# Patient Record
Sex: Male | Born: 1960 | Race: White | Hispanic: No | Smoking: Never smoker
Health system: Southern US, Community
[De-identification: ages and names within clinical notes are randomized; demographics above are authoritative.]

## PROBLEM LIST (undated history)

## (undated) DIAGNOSIS — Z9889 Other specified postprocedural states: Secondary | ICD-10-CM

## (undated) DIAGNOSIS — G4730 Sleep apnea, unspecified: Secondary | ICD-10-CM

## (undated) DIAGNOSIS — Z8719 Personal history of other diseases of the digestive system: Secondary | ICD-10-CM

## (undated) DIAGNOSIS — I10 Essential (primary) hypertension: Secondary | ICD-10-CM

## (undated) HISTORY — DX: Other specified postprocedural states: Z98.890

## (undated) HISTORY — DX: Personal history of other diseases of the digestive system: Z87.19

## (undated) HISTORY — PX: HERNIA REPAIR: SHX51

## (undated) HISTORY — PX: CARPAL TUNNEL RELEASE: SHX101

## (undated) HISTORY — PX: SHOULDER SURGERY: SHX246

---

## 2005-01-19 ENCOUNTER — Ambulatory Visit (HOSPITAL_COMMUNITY): Admission: RE | Admit: 2005-01-19 | Discharge: 2005-01-19 | Payer: Self-pay | Admitting: Orthopedic Surgery

## 2009-02-10 ENCOUNTER — Encounter: Admission: RE | Admit: 2009-02-10 | Discharge: 2009-02-10 | Payer: Self-pay | Admitting: General Surgery

## 2009-02-14 ENCOUNTER — Ambulatory Visit (HOSPITAL_BASED_OUTPATIENT_CLINIC_OR_DEPARTMENT_OTHER): Admission: RE | Admit: 2009-02-14 | Discharge: 2009-02-14 | Payer: Self-pay | Admitting: General Surgery

## 2010-06-28 LAB — BASIC METABOLIC PANEL
BUN: 20 mg/dL (ref 6–23)
CO2: 29 mEq/L (ref 19–32)
Calcium: 9.2 mg/dL (ref 8.4–10.5)
Chloride: 107 mEq/L (ref 96–112)
Creatinine, Ser: 1.18 mg/dL (ref 0.4–1.5)
GFR calc Af Amer: >60 mL/min (ref >60)
GFR calc non Af Amer: >60 mL/min (ref >60)
Glucose, Bld: 109 mg/dL — ABNORMAL HIGH (ref 70–99)
Potassium: 4.3 mEq/L (ref 3.5–5.1)
Sodium: 141 mEq/L (ref 135–145)

## 2010-06-28 LAB — DIFFERENTIAL
Basophils Absolute: 0 10*3/uL (ref 0.0–0.1)
Basophils Relative: 0 % (ref 0–1)
Eosinophils Absolute: 0.3 10*3/uL (ref 0.0–0.7)
Eosinophils Relative: 4 % (ref 0–5)
Lymphocytes Relative: 30 % (ref 12–46)
Lymphs Abs: 2.3 10*3/uL (ref 0.7–4.0)
Monocytes Absolute: 0.4 10*3/uL (ref 0.1–1.0)
Monocytes Relative: 5 % (ref 3–12)
Neutro Abs: 4.6 10*3/uL (ref 1.7–7.7)
Neutrophils Relative %: 61 % (ref 43–77)

## 2010-06-28 LAB — CBC
HCT: 40.7 % (ref 39.0–52.0)
Hemoglobin: 14.1 g/dL (ref 13.0–17.0)
MCHC: 34.5 g/dL (ref 30.0–36.0)
MCV: 88.4 fL (ref 78.0–100.0)
Platelets: 238 10*3/uL (ref 150–400)
RBC: 4.6 MIL/uL (ref 4.22–5.81)
RDW: 13.9 % (ref 11.5–15.5)
WBC: 7.6 10*3/uL (ref 4.0–10.5)

## 2010-07-02 ENCOUNTER — Observation Stay (HOSPITAL_COMMUNITY)
Admission: EM | Admit: 2010-07-02 | Discharge: 2010-07-04 | Disposition: A | Discharge status: Home / Self Care | Payer: Managed Care, Other (non HMO) | Attending: Family Medicine | Admitting: Family Medicine

## 2010-07-02 ENCOUNTER — Emergency Department (HOSPITAL_COMMUNITY): Payer: Managed Care, Other (non HMO)

## 2010-07-02 DIAGNOSIS — R42 Dizziness and giddiness: Principal | ICD-10-CM | POA: Insufficient documentation

## 2010-07-02 DIAGNOSIS — I10 Essential (primary) hypertension: Secondary | ICD-10-CM | POA: Insufficient documentation

## 2010-07-02 DIAGNOSIS — R112 Nausea with vomiting, unspecified: Secondary | ICD-10-CM | POA: Insufficient documentation

## 2010-07-02 LAB — POCT I-STAT, CHEM 8
BUN: 25 mg/dL — ABNORMAL HIGH (ref 6–23)
Calcium, Ion: 1.15 mmol/L (ref 1.12–1.32)
Creatinine, Ser: 1 mg/dL (ref 0.4–1.5)
Glucose, Bld: 127 mg/dL — ABNORMAL HIGH (ref 70–99)
HCT: 44 % (ref 39.0–52.0)
Hemoglobin: 15 g/dL (ref 13.0–17.0)
Potassium: 3.8 mEq/L (ref 3.5–5.1)
Sodium: 140 mEq/L (ref 135–145)
TCO2: 25 mmol/L (ref 0–100)

## 2010-07-02 LAB — RAPID URINE DRUG SCREEN, HOSP PERFORMED
Amphetamines: NOT DETECTED
Cocaine: NOT DETECTED
Opiates: NOT DETECTED
Tetrahydrocannabinol: NOT DETECTED

## 2010-07-02 MED ORDER — GADOBENATE DIMEGLUMINE 529 MG/ML IV SOLN
20.0000 mL | Freq: Once | INTRAVENOUS | Status: AC | PRN
  Administered 2010-07-02: 20 mL via INTRAVENOUS

## 2010-07-03 LAB — CBC
MCH: 29.7 pg (ref 26.0–34.0)
MCHC: 33.3 g/dL (ref 30.0–36.0)
Platelets: 216 10*3/uL (ref 150–400)
RBC: 4.88 MIL/uL (ref 4.22–5.81)
WBC: 6.9 10*3/uL (ref 4.0–10.5)

## 2010-07-03 LAB — BASIC METABOLIC PANEL
CO2: 26 mEq/L (ref 19–32)
Calcium: 8.6 mg/dL (ref 8.4–10.5)
Chloride: 109 mEq/L (ref 96–112)
Creatinine, Ser: 1.07 mg/dL (ref 0.4–1.5)
GFR calc Af Amer: >60 mL/min (ref >60)
Potassium: 4.3 mEq/L (ref 3.5–5.1)
Sodium: 141 mEq/L (ref 135–145)

## 2010-07-03 LAB — TSH: TSH: 1.106 u[IU]/mL (ref 0.350–4.500)

## 2010-07-03 LAB — LIPID PANEL
Cholesterol: 143 mg/dL (ref 0–200)
HDL: 27 mg/dL — ABNORMAL LOW (ref >39)
LDL Cholesterol: 85 mg/dL (ref 0–99)
Triglycerides: 154 mg/dL — ABNORMAL HIGH (ref <150)
VLDL: 31 mg/dL (ref 0–40)

## 2010-07-08 NOTE — Discharge Summary (Signed)
Joshua, Myers               ACCOUNT NO.:  [id number]  MEDICAL RECORD NO.:  [id number]           PATIENT TYPE:  O  LOCATION:  5038                         FACILITY:  [facility]  PHYSICIAN:  Joshua Koch, MD        DATE OF BIRTH:  [birth year]  DATE OF ADMISSION:  07/02/2010 DATE OF DISCHARGE:  07/04/2010                              DISCHARGE SUMMARY   PRIMARY CARE PHYSICIAN:  C. Duane Lope, MD  DISCHARGE DIAGNOSES: 1. Vertigo/syncopal episode. 2. Hypertension. 3. Morbid obesity. 4. 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  DISCHARGE MEDICATIONS:  Lisinopril 20 mg once daily.  NEW MEDICATION: 1. Meclizine 25 mg 1 tablet t.i.d. p.r.n. 2. Fish oil over the counter 2 tablets daily. 3. Ibuprofen 200 mg 4 tablets daily p.r.n. 4. Glucosamine over the counter 1 tablet daily. 5. Loratadine 10 mg 1 tablet daily. 6. Testosterone placement per his urologist.  BRIEF HISTORY AND PHYSICAL:  This is a very pleasant 50 year old male came in with severe bout of dizziness and states that he felt he is going to fall out of the window.  He states the room was spinning, this occurred when he got up to the bathroom to look out of the window.  He started having significant nausea and vomiting and his eyes were jumping back and forth.  EMS was called and he was brought to the hospital.  MRI of the head showed no significant intracranial abnormality.  The patient was admitted for further workup.  1. Vertigo.  The patient had an MRI, which was negative.  The patient     had no recurrence of symptoms, although felt slightly the same way     on day of admission.  Day of discharge, the patient was walking     around, talking, and ambulating well.  I did get PT/OT to assess     him and on review of him I did not notice any ear pain or any     redness of the ear.  The patient states that he has usually     sinusitis, which sometimes causes clotting of his eustachian tube.     The patient also states that  he has felt this way slightly for the     past week or so.  I nevertheless believe that he has an otolith     that is probably dislodged and caused nystagmus initially and then     subsequently his vertigo has resolved.  Physical therapist and     occupational therapist once again did not feel that this was BPPV.  I have     cautioned the patient regarding driving for a week until at least     he is seen by his primary care physician. 2. Hypertension.  This is stable in the hospital.  The patient will     continue on lisinopril as an outpatient. 3. Obesity.  Outpatient consult with PCP regarding weight management     and other issues. 4. 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  The patient will follow up     with Urology for further recommendations regarding this.  The patient is discharged  home in stable state.  Blood pressure was 120- 138.  The patient is not orthostatic.  Pulse rate 54, respirations 19, temperature is 98.0.          ______________________________ Joshua Koch, MD     JS/MEDQ  D:  07/04/2010  T:  07/04/2010  Job:  [id number]  cc:   C. Duane Lope, M.D.  Electronically Signed by Joshua Koch MD on 07/08/2010 07:17:50 AM

## 2010-07-23 NOTE — H&P (Signed)
Joshua Myers, Joshua Myers               ACCOUNT NO.:  [id number]  MEDICAL RECORD NO.:  [id number]           PATIENT TYPE:  E  LOCATION:  [facility]                         FACILITY:  [facility]  PHYSICIAN:  Peggye Pitt, M.D. DATE OF BIRTH:  02-21-1961  DATE OF ADMISSION:  07/02/2010 DATE OF DISCHARGE:                             HISTORY & PHYSICAL   PRIMARY CARE PHYSICIAN:  C. Duane Lope, MD  CHIEF COMPLAINT:  "Dizziness."  HISTORY OF PRESENT ILLNESS:  Joshua Myers is a pleasant 50 year old Caucasian gentleman with only significant past medical history for hypertension, who was doing fine when he woke up this morning.  Had breakfast, went to the bathroom and was looking at the window when suddenly he experienced a severe bout of dizziness that he stated he felt was going to fall off the window.  The room he says was spinning to his right.  He immediately started having significant nausea and vomiting.  His wife noted that his eyes were "jumping back-and-forth." Because she was very concerned, she called EMS who brought him into the hospital for evaluation.  An MRI has already been done in the emergency department, which did not show evidence for posterior circulation CVA. However, we are asked to admit him at this time because he is still very ataxic and has not been able to ambulate in the emergency department even with assistance given his severe vertigo.  He currently denies any chest pain, shortness of breath, blurry or double vision, although he states he cannot focus on an object.  ALLERGIES:  No known drug allergies.  PAST MEDICAL HISTORY:  Significant for hypertension.  HOME MEDICATIONS: 1. Lisinopril 20 mg daily. 2. He also takes a variety of over-the-counter supplements and he is     not exactly sure what these are, but he knows that it includes fish     oil, flaxseed oil, and something for his joints.  SOCIAL HISTORY:  Denies any alcohol, tobacco, or illicit drug use.   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  FAMILY HISTORY:  Significant for lung cancer in his mother and colon cancer in his father.  REVIEW OF SYSTEMS:  Negative except as mentioned in history of present illness.  PHYSICAL EXAM:  VITAL SIGNS:  On admission, blood pressure 120/70, heart rate 55, respirations 18, sats of 96% on room air, and temperature of 98.0. GENERAL:  He is alert, awake, oriented x3.  His dizziness is currently a little bit better. HEENT:  Normocephalic, atraumatic.  His pupils are equal, round, and reactive to light.  He has intact extraocular movements.  At the moment, he does not have nystagmus. NECK:  Supple.  No JVD.  No lymphadenopathy.  No bruits.  No goiter. HEART:  Regular rate and rhythm.  No murmurs, rubs, or gallops. LUNGS:  Clear to auscultation bilaterally. ABDOMEN:  Soft, nontender, nondistended with positive bowel sounds. EXTREMITIES:  He has no clubbing, cyanosis, or edema. NEUROLOGIC:  Grossly intact and nonfocal.  I have not ambulated him.  LABORATORY DATA:  Labs on admission; sodium 140, potassium 3.8, chloride 107, bicarb 25, BUN  25, creatinine 1.0, glucose of 127.  An MRI of the brain that shows no acute intracranial abnormalities.  ASSESSMENT AND PLAN: 1. Vertigo.  It is somewhat improved since his presentation, however,     he is still quite ataxic.  An MRI was negative for posterior     circulation cerebrovascular accident.  I wonder if this could be     benign paroxysmal positional vertigo.  I will have PT evaluate him     for vestibular training.  We will admit him to the hospital     overnight as an observation.  We will give him meclizine and Zofran     as needed.  Hopefully, his gait will improve by the morning and     will be able to discharge him without any further events. 2. For his hypertension, I will continue his lisinopril. 3. For his deep vein thrombosis prophylaxis, I will place him on      Lovenox.     Peggye Pitt, M.D.     EH/MEDQ  D:  07/02/2010  T:  07/02/2010  Job:  [id number]  cc:   C. Duane Lope, M.D.  Electronically Signed by Peggye Pitt M.D. on 07/23/2010 09:43:12 AM

## 2011-03-14 ENCOUNTER — Emergency Department (HOSPITAL_COMMUNITY)
Admission: EM | Admit: 2011-03-14 | Discharge: 2011-03-14 | Disposition: A | Discharge status: Home / Self Care | Payer: Managed Care, Other (non HMO) | Attending: Emergency Medicine | Admitting: Emergency Medicine

## 2011-03-14 ENCOUNTER — Emergency Department (HOSPITAL_COMMUNITY): Payer: Managed Care, Other (non HMO)

## 2011-03-14 ENCOUNTER — Encounter: Payer: Self-pay | Admitting: Emergency Medicine

## 2011-03-14 DIAGNOSIS — Z79899 Other long term (current) drug therapy: Secondary | ICD-10-CM | POA: Insufficient documentation

## 2011-03-14 DIAGNOSIS — R109 Unspecified abdominal pain: Secondary | ICD-10-CM | POA: Insufficient documentation

## 2011-03-14 DIAGNOSIS — R319 Hematuria, unspecified: Secondary | ICD-10-CM | POA: Insufficient documentation

## 2011-03-14 DIAGNOSIS — R10A1 Flank pain, right side: Secondary | ICD-10-CM

## 2011-03-14 HISTORY — DX: Personal history of other diseases of the digestive system: Z87.19

## 2011-03-14 HISTORY — DX: Other specified postprocedural states: Z98.890

## 2011-03-14 HISTORY — DX: Sleep apnea, unspecified: G47.30

## 2011-03-14 LAB — URINALYSIS, ROUTINE W REFLEX MICROSCOPIC
Bilirubin Urine: NEGATIVE
Leukocytes, UA: NEGATIVE
Protein, ur: NEGATIVE mg/dL
Specific Gravity, Urine: 1.006 (ref 1.005–1.030)
Urobilinogen, UA: 0.2 mg/dL (ref 0.0–1.0)
pH: 6 (ref 5.0–8.0)

## 2011-03-14 LAB — URINE MICROSCOPIC-ADD ON

## 2011-03-14 MED ORDER — HYDROMORPHONE HCL PF 1 MG/ML IJ SOLN
1.0000 mg | Freq: Once | INTRAMUSCULAR | Status: AC
  Administered 2011-03-14: 1 mg via INTRAMUSCULAR
  Filled 2011-03-14: qty 1

## 2011-03-14 MED ORDER — METAXALONE 800 MG PO TABS: 800.0000 mg | ORAL_TABLET | Freq: Three times a day (TID) | ORAL | Status: DC

## 2011-03-14 MED ORDER — METHOCARBAMOL 500 MG PO TABS: 500.0000 mg | ORAL_TABLET | Freq: Four times a day (QID) | ORAL | Status: AC | PRN

## 2011-03-14 NOTE — ED Notes (Signed)
Patient transported to CT 

## 2011-03-14 NOTE — ED Notes (Signed)
Rt flank pain x several days. Denies radiation, urinary sx.

## 2011-03-14 NOTE — ED Provider Notes (Signed)
History     CSN: [id number] Arrival date & time: 03/14/2011  6:11 PM   First MD Initiated Contact with Patient 03/14/11 2043      Chief Complaint  Patient presents with  . Flank Pain    (Consider location/radiation/quality/duration/timing/severity/associated sxs/prior treatment) HPI Comments: Patient reports sharp right flank pain that began yesterday.  Pain is intermittent, is sharp, currently 5/10 but 10/10 at its worst, occasionally radiates into abdomen.  Pain is worse with certain positions and movement.  Denies fevers, cough, SOB, CP, abdominal pain, testicular pain, N/V, dysuria, urinary frequency or urgency.  No hx of kidney stones, no known family history of kidney stones.  No recent heavy lifting or other injury.    Patient is a 50 y.o. male presenting with flank pain. The history is provided by the patient.  Flank Pain    Past Medical History  Diagnosis Date  . Sleep apnea     Past Surgical History  Procedure Date  . Hernia repair   . Shoulder surgery   . Carpal tunnel release     No family history on file.  History  Substance Use Topics  . Smoking status: Never Smoker   . Smokeless tobacco: Not on file  . Alcohol Use: No      Review of Systems  Genitourinary: Positive for flank pain.  All other systems reviewed and are negative.    Allergies  Other  Home Medications   Current Outpatient Rx  Name Route Sig Dispense Refill  . IBUPROFEN 600 MG PO TABS Oral Take 600 mg by mouth every 6 (six) hours as needed. For inflammation    . LISINOPRIL 10 MG PO TABS Oral Take 10 mg by mouth daily.       BP 125/85  Pulse 117  Temp(Src) 98 F (36.7 C) (Oral)  Resp 16  SpO2 95%  Physical Exam  Nursing note and vitals reviewed. Constitutional: He is oriented to person, place, and time. He appears well-developed and well-nourished.  HENT:  Head: Normocephalic and atraumatic.  Neck: Neck supple.  Cardiovascular: Normal rate, regular rhythm and normal  heart sounds.   Pulmonary/Chest: Breath sounds normal. No respiratory distress. He has no wheezes. He has no rales. He exhibits no tenderness.  Abdominal: Soft. Bowel sounds are normal. He exhibits no distension and no mass. There is no tenderness. There is no rebound, no guarding and no CVA tenderness.  Neurological: He is alert and oriented to person, place, and time.    ED Course  Procedures (including critical care time)  Labs Reviewed  URINALYSIS, ROUTINE W REFLEX MICROSCOPIC - Abnormal; Notable for the following:    Hgb urine dipstick TRACE (*)    All other components within normal limits  URINE MICROSCOPIC-ADD ON   Ct Abdomen Pelvis Wo Contrast  03/14/2011  *RADIOLOGY REPORT*  Clinical Data: Right or left flank pain for 2 days.  CT ABDOMEN AND PELVIS WITHOUT CONTRAST  Technique:  Multidetector CT imaging of the abdomen and pelvis was performed following the standard protocol without intravenous contrast.  Comparison: None.  Findings: Limited images through the lung bases demonstrate no significant appreciable abnormality. The heart size is within normal limits. No pleural or pericardial effusion.  Intra-abdominal organ evaluation is limited without intravenous contrast.  Within this limitation, unremarkable liver, biliary system, spleen, pancreas, adrenal glands.  Symmetric renal size. No hydronephrosis or hydroureter.  No urinary tract calculi.  No bowel obstruction.  No CT evidence for colitis.  Colonic diverticulosis without CT evidence  for diverticulitis.  Normal appendix.  No free intraperitoneal air or fluid.  Suggestion of prior periumbilical hernia repair.  Small fat containing left inguinal hernia.  Thin-walled bladder.  Multilevel degenerative changes.  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  IMPRESSION: No hydronephrosis or urinary tract calculi identified.  Normal appendix.  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  Original Report  Authenticated By: Waneta Martins, M.D.     1. Right flank pain   2. Hematuria       MDM  Patient with right flank pain and trace hematuria.  Pain relieved in ED.  Ct scan is normal.  Pain is exacerbated with movement, suspect he may have muscular strain/spasm thought possibly could have passed a kidney stone.  Pt to follow up with Dr Retta Diones, his urologist.          Rise Patience, [place] 03/15/11 [phone]

## 2011-03-14 NOTE — ED Notes (Signed)
Returned from ct 

## 2011-03-14 NOTE — ED Notes (Signed)
Pt c/o right flank pain off and on since yesterday.  No nausea or vomiting. Denies urinary symptoms

## 2011-03-21 NOTE — ED Provider Notes (Signed)
Medical screening examination/treatment/procedure(s) were performed by non-physician practitioner and as supervising physician I was immediately available for consultation/collaboration.   Suzi Roots, MD 03/21/11 [phone]

## 2011-07-09 ENCOUNTER — Ambulatory Visit (INDEPENDENT_AMBULATORY_CARE_PROVIDER_SITE_OTHER): Payer: Managed Care, Other (non HMO) | Admitting: Surgery

## 2011-07-09 ENCOUNTER — Encounter (INDEPENDENT_AMBULATORY_CARE_PROVIDER_SITE_OTHER): Payer: Self-pay | Admitting: Surgery

## 2011-07-09 VITALS — BP 128/82 | HR 80 | Temp 97.4°F | Resp 20 | Ht 70.0 in | Wt 279.0 lb

## 2011-07-09 DIAGNOSIS — R1033 Periumbilical pain: Secondary | ICD-10-CM

## 2011-07-09 DIAGNOSIS — R109 Unspecified abdominal pain: Secondary | ICD-10-CM

## 2011-07-09 NOTE — Patient Instructions (Signed)
Muscle Strain A muscle strain, or pulled muscle, occurs when a muscle is over-stretched. A small number of muscle fibers may also be torn. This is especially common in athletes. This happens when a sudden violent force placed on a muscle pushes it past its capacity. Usually, recovery from a pulled muscle takes 1 to 2 weeks. But complete healing will take 5 to 6 weeks. There are millions of muscle fibers. Following injury, your body will usually return to normal quickly. HOME CARE INSTRUCTIONS   While awake, apply ice to the sore muscle for 15 to 20 minutes each hour for the first 2 days. Put ice in a plastic bag and place a towel between the bag of ice and your skin.   Do not use the pulled muscle for several days. Do not use the muscle if you have pain.   You may wrap the injured area with an elastic bandage for comfort. Be careful not to bind it too tightly. This may interfere with blood circulation.   Only take over-the-counter or prescription medicines for pain, discomfort, or fever as directed by your caregiver. Do not use aspirin as this will increase bleeding (bruising) at injury site.   Warming up before exercise helps prevent muscle strains.  SEEK MEDICAL CARE IF:  There is increased pain or swelling in the affected area. MAKE SURE YOU:   Understand these instructions.   Will watch your condition.   Will get help right away if you are not doing well or get worse.  Document Released: 03/12/2005 Document Revised: 03/01/2011 Document Reviewed: 10/09/2006 [facility] Patient Information 2012 [place], [place].  Managing Pain  Pain after surgery or related to activity is often due to strain/injury to muscle, tendon, nerves and/or incisions.  This pain is usually short-term and will improve in a few months.   Many people find it helpful to do the following things TOGETHER to help speed the process of healing and to get back to regular activity more quickly:  1. Avoid heavy physical  activity a.  no lifting greater than 20 pounds b. Do not "push through" the pain.  Listen to your body and avoid positions and maneuvers than reproduce the pain c. Walking is okay as tolerated, but go slowly and stop when getting sore.  d. Remember: If it hurts to do it, then don't do it! 2. Take Anti-inflammatory medication  a. Take with food/snack around the clock for 1-2 weeks i. This helps the muscle and nerve tissues become less irritable and calm down faster b. Choose ONE of the following over-the-counter medications: i. Naproxen 220mg  tabs (ex. Aleve) 1-2 pills twice a day  ii. Ibuprofen 200mg  tabs (ex. Advil, Motrin) 3-4 pills with every meal and just before bedtime iii. Acetaminophen 500mg  tabs (Tylenol) 1-2 pills with every meal and just before bedtime 3. Use a Heating pad or Ice/Cold Pack a. 4-6 times a day b. May use warm bath/hottub  or showers 4. Try Gentle Massage and/or Stretching  a. at the area of pain many times a day b. stop if you feel pain - do not overdo it  Try these steps together to help you body heal faster and avoid making things get worse.  Doing just one of these things may not be enough.    If you are not getting better after two weeks or are noticing you are getting worse, contact our office for further advice; we may need to re-evaluate you & see what other things we can do to  to help.   

## 2011-07-09 NOTE — Progress Notes (Signed)
Subjective:     Patient ID: Joshua Myers, male   DOB: [birth year], 51 y.o.   MRN: [id number]  HPI  Joshua Myers  1960-11-05 [id number]  Patient Care Team: Daisy Floro, MD as PCP - General (Family Medicine)  This patient is a 51 y.o.male who presents today for surgical evaluation.   Reason for visit: Umbilical pain. History of prior umbilical hernia repair.  Patient is an obese very active male. He had an open repair with mesh of an umbilical hernia in 2010 by Dr. Zachery Dakins.   Patient notes 2 days ago some sharp pain and a small lump at his bellybutton. He was concerned about a possible recurrent hernia. He's had some loose stools recently. They are getting better. No fevers or chills.  No sick contacts or travel history. He was worried that hernia may have come back and wished to be seen.  Patient Active Problem List  Diagnoses  . Umbilical pain    Past Medical History  Diagnosis Date  . Sleep apnea   . History of hernia repair   . History of umbilical hernia repair     Past Surgical History  Procedure Date  . Shoulder surgery   . Carpal tunnel release   . Hernia repair     umb hernia    History   Social History  . Marital Status: Married    Spouse Name: N/A    Number of Children: N/A  . Years of Education: N/A   Occupational History  . Not on file.   Social History Main Topics  . Smoking status: Never Smoker   . Smokeless tobacco: Not on file  . Alcohol Use: No  . Drug Use: No  . Sexually Active:    Other Topics Concern  . Not on file   Social History Narrative  . No narrative on file    Family History  Problem Relation Age of Onset  . Cancer Mother     lung  . Cancer Father     colon    Current Outpatient Prescriptions  Medication Sig Dispense Refill  . ibuprofen (ADVIL,MOTRIN) 600 MG tablet Take 600 mg by mouth every 6 (six) hours as needed. For inflammation      . lisinopril (PRINIVIL,ZESTRIL) 10 MG tablet Take 10 mg by mouth  daily.          Allergies  Allergen Reactions  . Other     Pt/wife thinks the allergy is to Cipro, but is unsure.    BP 128/82  Pulse 80  Temp(Src) 97.4 F (36.3 C) (Temporal)  Resp 20  Ht 5\' 10"  (1.778 m)  Wt 279 lb (126.554 kg)  BMI 40.03 kg/m2    Review of Systems  Constitutional: Negative for fever, chills and diaphoresis.  HENT: Negative for sore throat, trouble swallowing and neck pain.   Eyes: Negative for photophobia and visual disturbance.  Respiratory: Negative for choking and shortness of breath.   Cardiovascular: Negative for chest pain and palpitations.  Gastrointestinal: Positive for abdominal pain and diarrhea. Negative for nausea, vomiting, constipation, blood in stool, abdominal distention, anal bleeding and rectal pain.  Genitourinary: Negative for dysuria, urgency, difficulty urinating and testicular pain.  Musculoskeletal: Negative for myalgias, arthralgias and gait problem.  Skin: Negative for color change and rash.  Neurological: Negative for dizziness, speech difficulty, weakness and numbness.  Hematological: Negative for adenopathy.  Psychiatric/Behavioral: Negative for hallucinations, confusion and agitation.       Objective:   Physical Exam  Constitutional: He is oriented to person, place, and time. He appears well-developed and well-nourished. No distress.  HENT:  Head: Normocephalic.  Mouth/Throat: Oropharynx is clear and moist. No oropharyngeal exudate.  Eyes: Conjunctivae and EOM are normal. Pupils are equal, round, and reactive to light. No scleral icterus.  Neck: Normal range of motion. No tracheal deviation present.  Cardiovascular: Normal rate, normal heart sounds and intact distal pulses.   Pulmonary/Chest: Effort normal. No respiratory distress.  Abdominal: Soft. He exhibits no distension. There is no tenderness. Hernia confirmed negative in the right inguinal area and confirmed negative in the left inguinal area.         Incisions  clean with normal healing ridges.  No hernias  Musculoskeletal: Normal range of motion. He exhibits no tenderness.  Neurological: He is alert and oriented to person, place, and time. No cranial nerve deficit. He exhibits normal muscle tone. Coordination normal.  Skin: Skin is warm and dry. No rash noted. He is not diaphoretic.  Psychiatric: He has a normal mood and affect. His behavior is normal.       Assessment:     Periumb pain & small lump - not definite for recurrent umbilical VWH    Plan:     Because it's not severely tender in the lump is only a centimeter in size and does not change with cough/Valsalva, I do not think that this is a true hernia. I suspect he had some muscle strain and developed a small hematoma there. He may have had a lump there all this time and did not notice until now. He has no symptoms of obstruction or infection. He does not look toxic.  I recommended a trial of anti-inflammatories and heat. It should improve over the next few weeks. If it worsens or does not improve, he may benefit from CAT scan or something more aggressive. Certainly with his heavy activity and large body habitus, he is at risk for hernia recurrence. However, I am not convinced he has evidence of it at this time. He feels reassured.  Followup p.r.n.

## 2011-08-07 DIAGNOSIS — D126 Benign neoplasm of colon, unspecified: Secondary | ICD-10-CM

## 2012-02-25 ENCOUNTER — Encounter: Payer: Self-pay | Admitting: Gastroenterology

## 2012-11-18 ENCOUNTER — Encounter: Payer: Self-pay | Admitting: Gastroenterology

## 2012-12-08 ENCOUNTER — Emergency Department (HOSPITAL_COMMUNITY)
Admission: EM | Admit: 2012-12-08 | Discharge: 2012-12-09 | Disposition: A | Discharge status: Home / Self Care | Payer: Self-pay | Attending: Emergency Medicine | Admitting: Emergency Medicine

## 2012-12-08 ENCOUNTER — Encounter (HOSPITAL_COMMUNITY): Payer: Self-pay | Admitting: Emergency Medicine

## 2012-12-08 DIAGNOSIS — Z79899 Other long term (current) drug therapy: Secondary | ICD-10-CM | POA: Insufficient documentation

## 2012-12-08 DIAGNOSIS — K5289 Other specified noninfective gastroenteritis and colitis: Secondary | ICD-10-CM | POA: Insufficient documentation

## 2012-12-08 DIAGNOSIS — Z8669 Personal history of other diseases of the nervous system and sense organs: Secondary | ICD-10-CM | POA: Insufficient documentation

## 2012-12-08 DIAGNOSIS — I10 Essential (primary) hypertension: Secondary | ICD-10-CM | POA: Insufficient documentation

## 2012-12-08 DIAGNOSIS — R51 Headache: Secondary | ICD-10-CM | POA: Insufficient documentation

## 2012-12-08 DIAGNOSIS — K529 Noninfective gastroenteritis and colitis, unspecified: Secondary | ICD-10-CM

## 2012-12-08 DIAGNOSIS — R197 Diarrhea, unspecified: Secondary | ICD-10-CM | POA: Insufficient documentation

## 2012-12-08 DIAGNOSIS — Z9889 Other specified postprocedural states: Secondary | ICD-10-CM | POA: Insufficient documentation

## 2012-12-08 DIAGNOSIS — R509 Fever, unspecified: Secondary | ICD-10-CM | POA: Insufficient documentation

## 2012-12-08 HISTORY — DX: Essential (primary) hypertension: I10

## 2012-12-08 LAB — CBC WITH DIFFERENTIAL/PLATELET
Basophils Absolute: 0 10*3/uL (ref 0.0–0.1)
Basophils Relative: 0 % (ref 0–1)
HCT: 47.1 % (ref 39.0–52.0)
Hemoglobin: 16.9 g/dL (ref 13.0–17.0)
Lymphocytes Relative: 8 % — ABNORMAL LOW (ref 12–46)
Lymphs Abs: 0.9 10*3/uL (ref 0.7–4.0)
MCHC: 35.9 g/dL (ref 30.0–36.0)
MCV: 88.2 fL (ref 78.0–100.0)
Monocytes Absolute: 0.6 10*3/uL (ref 0.1–1.0)
Monocytes Relative: 6 % (ref 3–12)
Neutro Abs: 9.4 10*3/uL — ABNORMAL HIGH (ref 1.7–7.7)
RDW: 14.3 % (ref 11.5–15.5)
WBC: 11 10*3/uL — ABNORMAL HIGH (ref 4.0–10.5)

## 2012-12-08 LAB — COMPREHENSIVE METABOLIC PANEL
ALT: 21 U/L (ref 0–53)
Albumin: 4 g/dL (ref 3.5–5.2)
Alkaline Phosphatase: 54 U/L (ref 39–117)
CO2: 26 mEq/L (ref 19–32)
Calcium: 9.3 mg/dL (ref 8.4–10.5)
Chloride: 100 mEq/L (ref 96–112)
Creatinine, Ser: 1.5 mg/dL — ABNORMAL HIGH (ref 0.50–1.35)
GFR calc Af Amer: 61 mL/min — ABNORMAL LOW (ref >90)
GFR calc non Af Amer: 53 mL/min — ABNORMAL LOW (ref >90)
Potassium: 3.9 mEq/L (ref 3.5–5.1)
Sodium: 137 mEq/L (ref 135–145)
Total Protein: 7.5 g/dL (ref 6.0–8.3)

## 2012-12-08 MED ORDER — ACETAMINOPHEN 325 MG PO TABS
650.0000 mg | ORAL_TABLET | Freq: Once | ORAL | Status: AC
  Administered 2012-12-08: 650 mg via ORAL
  Filled 2012-12-08: qty 2

## 2012-12-08 MED ORDER — SODIUM CHLORIDE 0.9 % IV BOLUS (SEPSIS)
1000.0000 mL | Freq: Once | INTRAVENOUS | Status: AC
  Administered 2012-12-09: 1000 mL via INTRAVENOUS

## 2012-12-08 MED ORDER — ACETAMINOPHEN 500 MG PO TABS
1000.0000 mg | ORAL_TABLET | Freq: Once | ORAL | Status: AC
  Administered 2012-12-09: 1000 mg via ORAL
  Filled 2012-12-08: qty 2

## 2012-12-08 NOTE — ED Notes (Signed)
Pt. reports diarrhea for 2 days with fever denies nausea or vomitting .

## 2012-12-08 NOTE — ED Notes (Signed)
Not given , pt. Took Tylenol at 6 pm prior to arrival .

## 2012-12-09 ENCOUNTER — Emergency Department (HOSPITAL_COMMUNITY): Payer: Managed Care, Other (non HMO)

## 2012-12-09 ENCOUNTER — Emergency Department (HOSPITAL_COMMUNITY): Payer: Self-pay

## 2012-12-09 LAB — URINALYSIS, ROUTINE W REFLEX MICROSCOPIC
Bilirubin Urine: NEGATIVE
Nitrite: NEGATIVE
Protein, ur: NEGATIVE mg/dL
Urobilinogen, UA: 0.2 mg/dL (ref 0.0–1.0)

## 2012-12-09 MED ORDER — IOHEXOL 300 MG/ML  SOLN
25.0000 mL | INTRAMUSCULAR | Status: AC
  Administered 2012-12-09: 25 mL via ORAL

## 2012-12-09 NOTE — ED Provider Notes (Signed)
CSN: [id number]     Arrival date & time 12/08/12  1922 History   First MD Initiated Contact with Patient 12/08/12 2255     Chief Complaint  Patient presents with  . Diarrhea   (Consider location/radiation/quality/duration/timing/severity/associated sxs/prior Treatment) HPI Comments: Opacities. Associated fever and lower abdominal pain. Pain sharp, crampy. Patient had fever prior to arrival coronal and 2.5. He denies any recent travel, recent antibiotics or recent illness. Denies any sick contacts.  Patient is a 52 y.o. male presenting with diarrhea. The history is provided by the patient.  Diarrhea Quality:  Watery Severity:  Moderate Onset quality:  Gradual Number of episodes:  Multiple Duration:  2 days Timing:  Constant Progression:  Worsening Relieved by:  Nothing Worsened by:  Nothing tried Ineffective treatments:  None tried Associated symptoms: abdominal pain (mild-moderate, diffuse lower), fever (Tmax 102, febrile here) and headaches (occasionally)   Associated symptoms: no chills, no recent cough, no URI and no vomiting     Past Medical History  Diagnosis Date  . Sleep apnea   . History of hernia repair   . History of umbilical hernia repair   . Hypertension    Past Surgical History  Procedure Laterality Date  . Shoulder surgery    . Carpal tunnel release    . Hernia repair      umb hernia   Family History  Problem Relation Age of Onset  . Cancer Mother     lung  . Cancer Father     colon   History  Substance Use Topics  . Smoking status: Never Smoker   . Smokeless tobacco: Not on file  . Alcohol Use: No    Review of Systems  Constitutional: Positive for fever (Tmax 102, febrile here). Negative for chills.  Respiratory: Negative for cough and shortness of breath.   Gastrointestinal: Positive for abdominal pain (mild-moderate, diffuse lower) and diarrhea. Negative for vomiting.  Genitourinary: Negative for dysuria, scrotal swelling and testicular  pain.  Neurological: Positive for headaches (occasionally).  All other systems reviewed and are negative.    Allergies  Other  Home Medications   Current Outpatient Rx  Name  Route  Sig  Dispense  Refill  . acetaminophen (TYLENOL) 325 MG tablet   Oral   Take 650 mg by mouth every 6 (six) hours as needed for pain.         . cholecalciferol (VITAMIN D) 1000 UNITS tablet   Oral   Take 2,000 Units by mouth daily.         . fish oil-omega-3 fatty acids 1000 MG capsule   Oral   Take 2 g by mouth daily.         Marland Kitchen ibuprofen (ADVIL,MOTRIN) 600 MG tablet   Oral   Take 600 mg by mouth every 6 (six) hours as needed. For inflammation         . lisinopril (PRINIVIL,ZESTRIL) 20 MG tablet   Oral   Take 20 mg by mouth daily.         Marland Kitchen loratadine (CLARITIN REDITABS) 10 MG dissolvable tablet   Oral   Take 10 mg by mouth daily.         . Potassium (POTASSIMIN PO)   Oral   Take 500 mg by mouth daily.         . TESTOSTERONE IM   Intramuscular   Inject 1 mL into the muscle every 21 ( twenty-one) days.          BP 128/61  Pulse 85  Temp(Src) 99.9 F (37.7 C) (Oral)  Resp 18  Ht 5\' 10"  (1.778 m)  Wt 262 lb (118.842 kg)  BMI 37.59 kg/m2  SpO2 96% Physical Exam  Nursing note and vitals reviewed. Constitutional: He is oriented to person, place, and time. He appears well-developed and well-nourished. No distress.  HENT:  Head: Normocephalic and atraumatic.  Mouth/Throat: No oropharyngeal exudate.  Eyes: EOM are normal. Pupils are equal, round, and reactive to light.  Neck: Normal range of motion. Neck supple.  Cardiovascular: Normal rate and regular rhythm.  Exam reveals no friction rub.   No murmur heard. Pulmonary/Chest: Effort normal and breath sounds normal. No respiratory distress. He has no wheezes. He has no rales.  Abdominal: Soft. He exhibits no distension and no mass. There is tenderness (mild, diffuse, lower). There is no rebound and no guarding.   Musculoskeletal: Normal range of motion. He exhibits no edema.  Neurological: He is alert and oriented to person, place, and time.  Skin: No rash noted. He is not diaphoretic.    ED Course  Procedures (including critical care time) Labs Review Labs Reviewed  CBC WITH DIFFERENTIAL - Abnormal; Notable for the following:    WBC 11.0 (*)    Neutrophils Relative % 85 (*)    Neutro Abs 9.4 (*)    Lymphocytes Relative 8 (*)    All other components within normal limits  COMPREHENSIVE METABOLIC PANEL - Abnormal; Notable for the following:    Creatinine, Ser 1.50 (*)    GFR calc non Af Amer 53 (*)    GFR calc Af Amer 61 (*)    All other components within normal limits  URINALYSIS, ROUTINE W REFLEX MICROSCOPIC   Imaging Review Ct Abdomen Pelvis Wo Contrast  12/09/2012   *RADIOLOGY REPORT*  Clinical Data: Diarrhea  CT ABDOMEN AND PELVIS WITHOUT CONTRAST  Technique:  Multidetector CT imaging of the abdomen and pelvis was performed following the standard protocol without intravenous contrast.  Comparison: Prior CT from 03/14/2011  Findings: The lung bases are clear.  The liver, gallbladder, spleen, adrenal glands, and kidneys demonstrate normal unenhanced appearance.  There is no evidence of bowel obstruction.  There is diffuse wall thickening with mild.  Enteric stranding about the ascending and transverse colon, suggestive of colitis.  The appendix is normal. Minimal inflammatory changes are also seen about the descending and sigmoid colon.  No free intraperitoneal air.  No loculated fluid collection.  The terminal ileum is normal.  Bladder and prostate are within normal limits.  No free air or fluid.  No pathologically enlarged intra-abdominal pelvic lymph nodes.  No acute osseous abnormality.  IMPRESSION: Diffuse wall thickening with mild perienteric inflammatory fat stranding about predominately the ascending and transverse colon, but also with involvement of the descending and sigmoid colon as  well.  Findings are consistent with colitis. No evidence of perforation.   Original Report Authenticated By: Rise Mu, M.D.    MDM   1. Diarrhea   2. Colitis     52 year old male presents with diarrhea for 2 days with associated fever. Also lower abdominal pain. Here febrile, mild tachycardia, otherwise vitals stable. Labs show mild renal insufficiency with creatinine 1.5 and lower GFR in the 50s. He has history of hypertension and some remote surgeries. Patient with mild diffuse lower abdominal tenderness without guarding or rebound. No epigastric or right upper quadrant tenderness. I will check a urine and check CT scan of his abdomen and pelvis with by mouth contrast only.  CT shows colitis. Patient stable for discharge, instructed to f/u with PCP.     Dagmar Hait, MD 12/09/12 [phone]

## 2012-12-09 NOTE — ED Notes (Signed)
Pt has completed CT and here with several days of diarrhea.  Pt in no distress at this time.  Bowel sounds present and hyperactive.  Pt aware that we need to obtain urine sample.  Will continue to monitor

## 2012-12-09 NOTE — ED Notes (Signed)
Pt is still having episodes of diarrhea. Pt has a decrease in temperature 98.6 oral at this time. Pt denies any pain at this time, plan of care is updated with verbal understanding and will continue to monitor pt. Pt is awaiting MD re-evaluation for disposition.

## 2015-04-14 ENCOUNTER — Other Ambulatory Visit: Payer: Self-pay | Admitting: Gastroenterology

## 2015-09-02 DIAGNOSIS — E291 Testicular hypofunction: Secondary | ICD-10-CM | POA: Diagnosis not present

## 2015-11-23 DIAGNOSIS — G4733 Obstructive sleep apnea (adult) (pediatric): Secondary | ICD-10-CM | POA: Diagnosis not present

## 2016-05-31 DIAGNOSIS — Z0000 Encounter for general adult medical examination without abnormal findings: Secondary | ICD-10-CM | POA: Diagnosis not present

## 2016-07-04 DIAGNOSIS — R350 Frequency of micturition: Secondary | ICD-10-CM | POA: Diagnosis not present

## 2016-07-04 DIAGNOSIS — N5201 Erectile dysfunction due to arterial insufficiency: Secondary | ICD-10-CM | POA: Diagnosis not present

## 2016-07-04 DIAGNOSIS — E291 Testicular hypofunction: Secondary | ICD-10-CM | POA: Diagnosis not present

## 2016-07-13 ENCOUNTER — Other Ambulatory Visit (HOSPITAL_COMMUNITY): Payer: Self-pay | Admitting: Orthopedic Surgery

## 2016-07-13 ENCOUNTER — Ambulatory Visit (HOSPITAL_COMMUNITY)
Admission: RE | Admit: 2016-07-13 | Discharge: 2016-07-13 | Disposition: A | Discharge status: Home / Self Care | Payer: Worker's Compensation | Source: Ambulatory Visit | Attending: Orthopedic Surgery | Admitting: Orthopedic Surgery

## 2016-07-13 DIAGNOSIS — T1590XA Foreign body on external eye, part unspecified, unspecified eye, initial encounter: Secondary | ICD-10-CM

## 2016-07-13 DIAGNOSIS — Z1389 Encounter for screening for other disorder: Secondary | ICD-10-CM | POA: Diagnosis not present

## 2016-08-14 NOTE — Progress Notes (Signed)
Please place orders in EPIC as patient is being scheduled for a pre-op appointment! Thank you! 

## 2016-08-16 NOTE — Progress Notes (Signed)
Please place orders in epic pre op is [phone] am thanks

## 2016-08-16 NOTE — Patient Instructions (Addendum)
Joshua Myers  08/16/2016   Your procedure is scheduled on: 08-21-16  Report to [facility]  elevators to 3rd floor to  [place] at 11:00 AM.    Call this number if you have problems the morning of surgery [phone]    Remember: ONLY 1 PERSON MAY GO WITH YOU TO SHORT STAY TO GET  READY MORNING OF [place].  Do not eat food or drink liquids :After Midnight. You may have a clear liquid diet from Midnight until 7:00 AM. After 7:00 AM, nothing by mouth     CLEAR LIQUID DIET   Foods Allowed                                                                     Foods Excluded  Coffee and tea, regular and decaf                             liquids that you cannot  Plain Jell-O in any flavor                                             see through such as: Fruit ices (not with fruit pulp)                                     milk, soups, orange juice  Iced Popsicles                                    All solid food Carbonated beverages, regular and diet                                    Cranberry, grape and apple juices Sports drinks like Gatorade Lightly seasoned clear broth or consume(fat free) Sugar, honey syrup  Sample Menu Breakfast                                Lunch                                     Supper Cranberry juice                    Beef broth                            Chicken broth Jell-O                                     Grape juice  Apple juice Coffee or tea                        Jell-O                                      Popsicle                                                Coffee or tea                        Coffee or tea  _____________________________________________________________________     Take these medicines the morning of surgery with A SIP OF WATER: None. You may bring and use your nasal spray as needed.                                You may not have any  metal on your body including hair pins and              piercings  Do not wear jewelry, make-up, lotions, powders or perfumes, deodorant             Men may shave face and neck.   Do not bring valuables to the hospital. [place].  Contacts, dentures or bridgework may not be worn into surgery.  Leave suitcase in the car. After surgery it may be brought to your room.   Please bring your mask and tubing for your CPAP machine               Please read over the following fact sheets you were given: _____________________________________________________________________             [facility] - Preparing for Surgery Before surgery, you can play an important role.  Because skin is not sterile, your skin needs to be as free of germs as possible.  You can reduce the number of germs on your skin by washing with CHG (chlorahexidine gluconate) soap before surgery.  CHG is an antiseptic cleaner which kills germs and bonds with the skin to continue killing germs even after washing. Please DO NOT use if you have an allergy to CHG or antibacterial soaps.  If your skin becomes reddened/irritated stop using the CHG and inform your nurse when you arrive at Short Stay. Do not shave (including legs and underarms) for at least 48 hours prior to the first CHG shower.  You may shave your face/neck. Please follow these instructions carefully:  1.  Shower with CHG Soap the night before surgery and the  morning of Surgery.  2.  If you choose to wash your hair, wash your hair first as usual with your  normal  shampoo.  3.  After you shampoo, rinse your hair and body thoroughly to remove the  shampoo.                           4.  Use CHG as you would any other liquid soap.  You can apply chg directly  to the skin and wash                       Gently with a scrungie or clean washcloth.  5.  Apply the CHG Soap to your body ONLY FROM THE NECK DOWN.   Do not use on face/  open                           Wound or open sores. Avoid contact with eyes, ears mouth and genitals (private parts).                       Wash face,  Genitals (private parts) with your normal soap.             6.  Wash thoroughly, paying special attention to the area where your surgery  will be performed.  7.  Thoroughly rinse your body with warm water from the neck down.  8.  DO NOT shower/wash with your normal soap after using and rinsing off  the CHG Soap.                9.  Pat yourself dry with a clean towel.            10.  Wear clean pajamas.            11.  Place clean sheets on your bed the night of your first shower and do not  sleep with pets. Day of Surgery : Do not apply any lotions/deodorants the morning of surgery.  Please wear clean clothes to the hospital/surgery center.  FAILURE TO FOLLOW THESE INSTRUCTIONS MAY RESULT IN THE CANCELLATION OF YOUR SURGERY PATIENT SIGNATURE_________________________________  NURSE SIGNATURE__________________________________  ________________________________________________________________________

## 2016-08-17 ENCOUNTER — Encounter (INDEPENDENT_AMBULATORY_CARE_PROVIDER_SITE_OTHER): Payer: Self-pay

## 2016-08-17 ENCOUNTER — Encounter (HOSPITAL_COMMUNITY): Payer: Self-pay

## 2016-08-17 ENCOUNTER — Encounter (HOSPITAL_COMMUNITY)
Admission: RE | Admit: 2016-08-17 | Discharge: 2016-08-17 | Disposition: A | Discharge status: Home / Self Care | Payer: Worker's Compensation | Source: Ambulatory Visit | Attending: Orthopedic Surgery | Admitting: Orthopedic Surgery

## 2016-08-17 DIAGNOSIS — Z01812 Encounter for preprocedural laboratory examination: Secondary | ICD-10-CM | POA: Insufficient documentation

## 2016-08-17 DIAGNOSIS — Z01810 Encounter for preprocedural cardiovascular examination: Secondary | ICD-10-CM | POA: Insufficient documentation

## 2016-08-17 DIAGNOSIS — I10 Essential (primary) hypertension: Secondary | ICD-10-CM | POA: Insufficient documentation

## 2016-08-17 LAB — BASIC METABOLIC PANEL
Anion gap: 6 (ref 5–15)
BUN: 23 mg/dL — AB (ref 6–20)
CO2: 26 mmol/L (ref 22–32)
Calcium: 8.9 mg/dL (ref 8.9–10.3)
Chloride: 107 mmol/L (ref 101–111)
Creatinine, Ser: 1.09 mg/dL (ref 0.61–1.24)
GFR calc Af Amer: >60 mL/min (ref >60)
GLUCOSE: 97 mg/dL (ref 65–99)
POTASSIUM: 4.6 mmol/L (ref 3.5–5.1)
Sodium: 139 mmol/L (ref 135–145)

## 2016-08-17 LAB — CBC
HEMATOCRIT: 45.7 % (ref 39.0–52.0)
Hemoglobin: 15.2 g/dL (ref 13.0–17.0)
MCH: 30.2 pg (ref 26.0–34.0)
MCHC: 33.3 g/dL (ref 30.0–36.0)
MCV: 90.9 fL (ref 78.0–100.0)
Platelets: 232 10*3/uL (ref 150–400)
RBC: 5.03 MIL/uL (ref 4.22–5.81)
RDW: 14.5 % (ref 11.5–15.5)
WBC: 7.1 10*3/uL (ref 4.0–10.5)

## 2016-08-20 MED ORDER — VANCOMYCIN HCL 10 G IV SOLR
1500.0000 mg | INTRAVENOUS | Status: AC
  Administered 2016-08-21: 1500 mg via INTRAVENOUS
  Filled 2016-08-20: qty 1500

## 2016-08-21 ENCOUNTER — Encounter (HOSPITAL_COMMUNITY)
Admission: RE | Disposition: A | Discharge status: Home / Self Care | Payer: Self-pay | Source: Ambulatory Visit | Attending: Orthopedic Surgery

## 2016-08-21 ENCOUNTER — Observation Stay (HOSPITAL_COMMUNITY)
Admission: RE | Admit: 2016-08-21 | Discharge: 2016-08-22 | Disposition: A | Discharge status: Home / Self Care | Payer: Worker's Compensation | Source: Ambulatory Visit | Attending: Orthopedic Surgery | Admitting: Orthopedic Surgery

## 2016-08-21 ENCOUNTER — Ambulatory Visit (HOSPITAL_COMMUNITY): Payer: Worker's Compensation | Admitting: Certified Registered"

## 2016-08-21 ENCOUNTER — Encounter (HOSPITAL_COMMUNITY): Payer: Self-pay | Admitting: Anesthesiology

## 2016-08-21 DIAGNOSIS — I10 Essential (primary) hypertension: Secondary | ICD-10-CM | POA: Diagnosis not present

## 2016-08-21 DIAGNOSIS — M7542 Impingement syndrome of left shoulder: Secondary | ICD-10-CM | POA: Diagnosis not present

## 2016-08-21 DIAGNOSIS — Z87891 Personal history of nicotine dependence: Secondary | ICD-10-CM | POA: Diagnosis not present

## 2016-08-21 DIAGNOSIS — X58XXXA Exposure to other specified factors, initial encounter: Secondary | ICD-10-CM | POA: Insufficient documentation

## 2016-08-21 DIAGNOSIS — G4730 Sleep apnea, unspecified: Secondary | ICD-10-CM | POA: Diagnosis not present

## 2016-08-21 DIAGNOSIS — Y929 Unspecified place or not applicable: Secondary | ICD-10-CM | POA: Insufficient documentation

## 2016-08-21 DIAGNOSIS — Y939 Activity, unspecified: Secondary | ICD-10-CM | POA: Diagnosis not present

## 2016-08-21 DIAGNOSIS — Z79899 Other long term (current) drug therapy: Secondary | ICD-10-CM | POA: Diagnosis not present

## 2016-08-21 DIAGNOSIS — Z881 Allergy status to other antibiotic agents status: Secondary | ICD-10-CM | POA: Insufficient documentation

## 2016-08-21 DIAGNOSIS — Z800 Family history of malignant neoplasm of digestive organs: Secondary | ICD-10-CM | POA: Diagnosis not present

## 2016-08-21 DIAGNOSIS — Z801 Family history of malignant neoplasm of trachea, bronchus and lung: Secondary | ICD-10-CM | POA: Insufficient documentation

## 2016-08-21 DIAGNOSIS — S46012A Strain of muscle(s) and tendon(s) of the rotator cuff of left shoulder, initial encounter: Principal | ICD-10-CM | POA: Insufficient documentation

## 2016-08-21 DIAGNOSIS — Z6841 Body Mass Index (BMI) 40.0 and over, adult: Secondary | ICD-10-CM | POA: Diagnosis not present

## 2016-08-21 DIAGNOSIS — Z888 Allergy status to other drugs, medicaments and biological substances status: Secondary | ICD-10-CM | POA: Insufficient documentation

## 2016-08-21 DIAGNOSIS — M75102 Unspecified rotator cuff tear or rupture of left shoulder, not specified as traumatic: Secondary | ICD-10-CM | POA: Diagnosis present

## 2016-08-21 DIAGNOSIS — M75121 Complete rotator cuff tear or rupture of right shoulder, not specified as traumatic: Secondary | ICD-10-CM | POA: Diagnosis present

## 2016-08-21 HISTORY — PX: SHOULDER OPEN ROTATOR CUFF REPAIR: SHX2407

## 2016-08-21 LAB — TYPE AND SCREEN
ABO/RH(D): A POS
ANTIBODY SCREEN: NEGATIVE

## 2016-08-21 LAB — ABO/RH: ABO/RH(D): A POS

## 2016-08-21 SURGERY — REPAIR, ROTATOR CUFF, OPEN
Anesthesia: General | Site: Shoulder | Laterality: Left

## 2016-08-21 MED ORDER — BISACODYL 5 MG PO TBEC: 5.0000 mg | DELAYED_RELEASE_TABLET | Freq: Every day | ORAL | Status: DC | PRN

## 2016-08-21 MED ORDER — SODIUM CHLORIDE 0.9 % IR SOLN
Status: AC
  Filled 2016-08-21: qty 500000

## 2016-08-21 MED ORDER — ROCURONIUM BROMIDE 10 MG/ML (PF) SYRINGE
PREFILLED_SYRINGE | INTRAVENOUS | Status: DC | PRN
  Administered 2016-08-21: 10 mg via INTRAVENOUS
  Administered 2016-08-21: 40 mg via INTRAVENOUS
  Administered 2016-08-21 (×3): 10 mg via INTRAVENOUS

## 2016-08-21 MED ORDER — PHENYLEPHRINE 40 MCG/ML (10ML) SYRINGE FOR IV PUSH (FOR BLOOD PRESSURE SUPPORT)
PREFILLED_SYRINGE | INTRAVENOUS | Status: AC
  Filled 2016-08-21: qty 10

## 2016-08-21 MED ORDER — MEPERIDINE HCL 50 MG/ML IJ SOLN: 6.2500 mg | INTRAMUSCULAR | Status: DC | PRN

## 2016-08-21 MED ORDER — OXYCODONE-ACETAMINOPHEN 5-325 MG PO TABS: 2.0000 | ORAL_TABLET | ORAL | Status: DC | PRN

## 2016-08-21 MED ORDER — FLUTICASONE PROPIONATE 50 MCG/ACT NA SUSP
2.0000 | Freq: Every day | NASAL | Status: DC
  Filled 2016-08-21: qty 16

## 2016-08-21 MED ORDER — MENTHOL 3 MG MT LOZG: 1.0000 | LOZENGE | OROMUCOSAL | Status: DC | PRN

## 2016-08-21 MED ORDER — ONDANSETRON HCL 4 MG/2ML IJ SOLN
INTRAMUSCULAR | Status: DC | PRN
  Administered 2016-08-21: 4 mg via INTRAVENOUS

## 2016-08-21 MED ORDER — METOCLOPRAMIDE HCL 5 MG PO TABS: 5.0000 mg | ORAL_TABLET | Freq: Three times a day (TID) | ORAL | Status: DC | PRN

## 2016-08-21 MED ORDER — METOCLOPRAMIDE HCL 5 MG/ML IJ SOLN: 5.0000 mg | Freq: Three times a day (TID) | INTRAMUSCULAR | Status: DC | PRN

## 2016-08-21 MED ORDER — POTASSIUM GLUCONATE 550 MG PO TABS
2.0000 | ORAL_TABLET | Freq: Every day | ORAL | Status: DC
  Filled 2016-08-21: qty 2

## 2016-08-21 MED ORDER — LIDOCAINE 2% (20 MG/ML) 5 ML SYRINGE
INTRAMUSCULAR | Status: AC
  Filled 2016-08-21: qty 5

## 2016-08-21 MED ORDER — CHLORHEXIDINE GLUCONATE 4 % EX LIQD: 60.0000 mL | Freq: Once | CUTANEOUS | Status: DC

## 2016-08-21 MED ORDER — ACETAMINOPHEN 650 MG RE SUPP: 650.0000 mg | Freq: Four times a day (QID) | RECTAL | Status: DC | PRN

## 2016-08-21 MED ORDER — DEXAMETHASONE SODIUM PHOSPHATE 10 MG/ML IJ SOLN
INTRAMUSCULAR | Status: DC | PRN
  Administered 2016-08-21: 10 mg via INTRAVENOUS

## 2016-08-21 MED ORDER — DEXTROSE 5 % IV SOLN
INTRAVENOUS | Status: DC | PRN
  Administered 2016-08-21: 20 ug/min via INTRAVENOUS

## 2016-08-21 MED ORDER — FENTANYL CITRATE (PF) 100 MCG/2ML IJ SOLN
INTRAMUSCULAR | Status: DC | PRN
  Administered 2016-08-21 (×2): 50 ug via INTRAVENOUS
  Administered 2016-08-21: 100 ug via INTRAVENOUS

## 2016-08-21 MED ORDER — SODIUM CHLORIDE 0.9 % IR SOLN
Status: DC | PRN
  Administered 2016-08-21: 500 mL

## 2016-08-21 MED ORDER — HYDROCODONE-ACETAMINOPHEN 5-325 MG PO TABS
1.0000 | ORAL_TABLET | ORAL | Status: DC | PRN
  Administered 2016-08-21 – 2016-08-22 (×5): 2 via ORAL
  Filled 2016-08-21: qty 2
  Filled 2016-08-21: qty 1
  Filled 2016-08-21 (×3): qty 2
  Filled 2016-08-21: qty 1

## 2016-08-21 MED ORDER — HYDROMORPHONE HCL 1 MG/ML IJ SOLN: 1.0000 mg | INTRAMUSCULAR | Status: DC | PRN

## 2016-08-21 MED ORDER — PHENOL 1.4 % MT LIQD
1.0000 | OROMUCOSAL | Status: DC | PRN
  Filled 2016-08-21: qty 177

## 2016-08-21 MED ORDER — ONDANSETRON HCL 4 MG PO TABS: 4.0000 mg | ORAL_TABLET | Freq: Four times a day (QID) | ORAL | Status: DC | PRN

## 2016-08-21 MED ORDER — BACITRACIN ZINC 500 UNIT/GM EX OINT
TOPICAL_OINTMENT | CUTANEOUS | Status: AC
  Filled 2016-08-21: qty 28.35

## 2016-08-21 MED ORDER — SUGAMMADEX SODIUM 500 MG/5ML IV SOLN
INTRAVENOUS | Status: AC
  Filled 2016-08-21: qty 5

## 2016-08-21 MED ORDER — LIDOCAINE 2% (20 MG/ML) 5 ML SYRINGE
INTRAMUSCULAR | Status: DC | PRN
  Administered 2016-08-21: 100 mg via INTRAVENOUS

## 2016-08-21 MED ORDER — POTASSIUM GLUCONATE ER 595 MG PO TBCR
1190.0000 mg | EXTENDED_RELEASE_TABLET | Freq: Every day | ORAL | Status: DC
  Administered 2016-08-21 – 2016-08-22 (×2): 1190 mg via ORAL
  Filled 2016-08-21 (×2): qty 2

## 2016-08-21 MED ORDER — ROCURONIUM BROMIDE 50 MG/5ML IV SOSY
PREFILLED_SYRINGE | INTRAVENOUS | Status: AC
  Filled 2016-08-21: qty 5

## 2016-08-21 MED ORDER — SUGAMMADEX SODIUM 500 MG/5ML IV SOLN
INTRAVENOUS | Status: DC | PRN
  Administered 2016-08-21: 500 mg via INTRAVENOUS

## 2016-08-21 MED ORDER — LACTATED RINGERS IV SOLN
INTRAVENOUS | Status: DC
  Administered 2016-08-21 (×2): via INTRAVENOUS

## 2016-08-21 MED ORDER — ACETAMINOPHEN 325 MG PO TABS: 650.0000 mg | ORAL_TABLET | Freq: Four times a day (QID) | ORAL | Status: DC | PRN

## 2016-08-21 MED ORDER — LACTATED RINGERS IV SOLN
INTRAVENOUS | Status: DC
  Administered 2016-08-21 – 2016-08-22 (×2): via INTRAVENOUS

## 2016-08-21 MED ORDER — HYDROMORPHONE HCL 1 MG/ML IJ SOLN: 0.2500 mg | INTRAMUSCULAR | Status: DC | PRN

## 2016-08-21 MED ORDER — DEXAMETHASONE SODIUM PHOSPHATE 10 MG/ML IJ SOLN
INTRAMUSCULAR | Status: AC
  Filled 2016-08-21: qty 1

## 2016-08-21 MED ORDER — VANCOMYCIN HCL IN DEXTROSE 1-5 GM/200ML-% IV SOLN
1000.0000 mg | Freq: Two times a day (BID) | INTRAVENOUS | Status: AC
  Administered 2016-08-22: 1000 mg via INTRAVENOUS
  Filled 2016-08-21: qty 200

## 2016-08-21 MED ORDER — DEXTROSE 5 % IV SOLN
500.0000 mg | Freq: Four times a day (QID) | INTRAVENOUS | Status: DC | PRN
  Filled 2016-08-21: qty 5

## 2016-08-21 MED ORDER — MIDAZOLAM HCL 2 MG/2ML IJ SOLN
INTRAMUSCULAR | Status: AC
  Filled 2016-08-21: qty 2

## 2016-08-21 MED ORDER — LISINOPRIL 20 MG PO TABS
20.0000 mg | ORAL_TABLET | Freq: Every day | ORAL | Status: DC
  Administered 2016-08-22: 20 mg via ORAL
  Filled 2016-08-21: qty 1

## 2016-08-21 MED ORDER — MIDAZOLAM HCL 5 MG/5ML IJ SOLN
INTRAMUSCULAR | Status: DC | PRN
  Administered 2016-08-21: 2 mg via INTRAVENOUS

## 2016-08-21 MED ORDER — FENTANYL CITRATE (PF) 100 MCG/2ML IJ SOLN
INTRAMUSCULAR | Status: AC
  Filled 2016-08-21: qty 2

## 2016-08-21 MED ORDER — PHENYLEPHRINE 40 MCG/ML (10ML) SYRINGE FOR IV PUSH (FOR BLOOD PRESSURE SUPPORT)
PREFILLED_SYRINGE | INTRAVENOUS | Status: DC | PRN
  Administered 2016-08-21 (×4): 80 ug via INTRAVENOUS

## 2016-08-21 MED ORDER — THROMBIN 5000 UNITS EX SOLR
CUTANEOUS | Status: AC
  Filled 2016-08-21: qty 5000

## 2016-08-21 MED ORDER — BUPIVACAINE-EPINEPHRINE (PF) 0.5% -1:200000 IJ SOLN
INTRAMUSCULAR | Status: DC | PRN
  Administered 2016-08-21: 30 mL via PERINEURAL

## 2016-08-21 MED ORDER — BUPIVACAINE LIPOSOME 1.3 % IJ SUSP
20.0000 mL | Freq: Once | INTRAMUSCULAR | Status: DC
  Filled 2016-08-21: qty 20

## 2016-08-21 MED ORDER — PROPOFOL 10 MG/ML IV BOLUS
INTRAVENOUS | Status: DC | PRN
  Administered 2016-08-21: 200 mg via INTRAVENOUS

## 2016-08-21 MED ORDER — SUCCINYLCHOLINE CHLORIDE 200 MG/10ML IV SOSY
PREFILLED_SYRINGE | INTRAVENOUS | Status: DC | PRN
  Administered 2016-08-21: 140 mg via INTRAVENOUS

## 2016-08-21 MED ORDER — HYDROCODONE-ACETAMINOPHEN 7.5-325 MG PO TABS: 1.0000 | ORAL_TABLET | Freq: Once | ORAL | Status: DC | PRN

## 2016-08-21 MED ORDER — ONDANSETRON HCL 4 MG/2ML IJ SOLN: 4.0000 mg | Freq: Four times a day (QID) | INTRAMUSCULAR | Status: DC | PRN

## 2016-08-21 MED ORDER — PROMETHAZINE HCL 25 MG/ML IJ SOLN: 6.2500 mg | INTRAMUSCULAR | Status: DC | PRN

## 2016-08-21 MED ORDER — PROPOFOL 10 MG/ML IV BOLUS
INTRAVENOUS | Status: AC
  Filled 2016-08-21: qty 20

## 2016-08-21 MED ORDER — ONDANSETRON HCL 4 MG/2ML IJ SOLN
INTRAMUSCULAR | Status: AC
  Filled 2016-08-21: qty 2

## 2016-08-21 MED ORDER — POLYETHYLENE GLYCOL 3350 17 G PO PACK: 17.0000 g | PACK | Freq: Every day | ORAL | Status: DC | PRN

## 2016-08-21 MED ORDER — EPHEDRINE 5 MG/ML INJ
INTRAVENOUS | Status: AC
Start: 2016-08-21 — End: 2016-08-21
  Filled 2016-08-21: qty 20

## 2016-08-21 MED ORDER — METHOCARBAMOL 500 MG PO TABS
500.0000 mg | ORAL_TABLET | Freq: Four times a day (QID) | ORAL | Status: DC | PRN
  Administered 2016-08-22: 500 mg via ORAL
  Filled 2016-08-21: qty 1

## 2016-08-21 MED ORDER — FLEET ENEMA 7-19 GM/118ML RE ENEM: 1.0000 | ENEMA | Freq: Once | RECTAL | Status: DC | PRN

## 2016-08-21 SURGICAL SUPPLY — 53 items
ADH SKN CLS APL DERMABOND .7 (GAUZE/BANDAGES/DRESSINGS)
AGENT HMST SPONGE THK3/8 (HEMOSTASIS) ×1
ANCH SUT 2 5.5 BABSR ASCP (Orthopedic Implant) IMPLANT
ANCHOR PEEK ZIP 5.5 NDL NO2 (Orthopedic Implant) IMPLANT
ATTRACTOMAT 16X20 MAGNETIC DRP (DRAPES) ×2 IMPLANT
BAG SPEC THK2 15X12 ZIP CLS (MISCELLANEOUS)
BAG ZIPLOCK 12X15 (MISCELLANEOUS) IMPLANT
BLADE OSCILLATING/SAGITTAL (BLADE) ×2
BLADE SW THK.38XMED LNG THN (BLADE) ×1 IMPLANT
BNDG COHESIVE 6X5 TAN STRL LF (GAUZE/BANDAGES/DRESSINGS) ×1 IMPLANT
BUR OVAL CARBIDE 4.0 (BURR) ×2 IMPLANT
COVER SURGICAL LIGHT HANDLE (MISCELLANEOUS) ×2 IMPLANT
DERMABOND ADVANCED (GAUZE/BANDAGES/DRESSINGS)
DERMABOND ADVANCED .7 DNX12 (GAUZE/BANDAGES/DRESSINGS) ×1 IMPLANT
DERMASPAN .5-.9MM 4X4CM SHOU (Miscellaneous) ×1 IMPLANT
DRAPE POUCH INSTRU U-SHP 10X18 (DRAPES) ×2 IMPLANT
DRSG AQUACEL AG ADV 3.5X 6 (GAUZE/BANDAGES/DRESSINGS) ×2 IMPLANT
DURAPREP 26ML APPLICATOR (WOUND CARE) ×2 IMPLANT
ELECT BLADE TIP CTD 4 INCH (ELECTRODE) ×1 IMPLANT
ELECT REM PT RETURN 15FT ADLT (MISCELLANEOUS) ×2 IMPLANT
GLOVE BIOGEL PI IND STRL 6.5 (GLOVE) ×1 IMPLANT
GLOVE BIOGEL PI IND STRL 8.5 (GLOVE) ×1 IMPLANT
GLOVE BIOGEL PI INDICATOR 6.5 (GLOVE) ×1
GLOVE BIOGEL PI INDICATOR 8.5 (GLOVE) ×1
GLOVE ECLIPSE 8.0 STRL XLNG CF (GLOVE) ×2 IMPLANT
GLOVE SURG SS PI 6.5 STRL IVOR (GLOVE) ×2 IMPLANT
GOWN STRL REUS W/TWL LRG LVL3 (GOWN DISPOSABLE) ×2 IMPLANT
GOWN STRL REUS W/TWL XL LVL3 (GOWN DISPOSABLE) ×2 IMPLANT
HEMOSTAT SPONGE AVITENE ULTRA (HEMOSTASIS) ×2 IMPLANT
KIT BASIN OR (CUSTOM PROCEDURE TRAY) ×2 IMPLANT
KIT POSITION SHOULDER SCHLEI (MISCELLANEOUS) ×2 IMPLANT
MANIFOLD NEPTUNE II (INSTRUMENTS) ×2 IMPLANT
NDL MA TROC 1/2 (NEEDLE) IMPLANT
NEEDLE MA TROC 1/2 (NEEDLE) IMPLANT
NS IRRIG 1000ML POUR BTL (IV SOLUTION) ×1 IMPLANT
PACK SHOULDER (CUSTOM PROCEDURE TRAY) ×2 IMPLANT
PAD ABD 8X10 STRL (GAUZE/BANDAGES/DRESSINGS) ×1 IMPLANT
POSITIONER SURGICAL ARM (MISCELLANEOUS) ×2 IMPLANT
SLING ARM IMMOBILIZER LRG (SOFTGOODS) ×2 IMPLANT
SPONGE LAP 4X18 X RAY DECT (DISPOSABLE) ×1 IMPLANT
STAPLER VISISTAT 35W (STAPLE) ×1 IMPLANT
SUCTION FRAZIER HANDLE 12FR (TUBING) ×1
SUCTION TUBE FRAZIER 12FR DISP (TUBING) ×1 IMPLANT
SUT BONE WAX W31G (SUTURE) ×2 IMPLANT
SUT ETHIBOND NAB CT1 #1 30IN (SUTURE) ×3 IMPLANT
SUT MNCRL AB 4-0 PS2 18 (SUTURE) ×2 IMPLANT
SUT VIC AB 0 CT1 27 (SUTURE) ×4
SUT VIC AB 0 CT1 27XBRD ANTBC (SUTURE) IMPLANT
SUT VIC AB 1 CT1 27 (SUTURE) ×4
SUT VIC AB 1 CT1 27XBRD ANTBC (SUTURE) ×2 IMPLANT
SUT VIC AB 2-0 CT1 27 (SUTURE) ×4
SUT VIC AB 2-0 CT1 TAPERPNT 27 (SUTURE) ×2 IMPLANT
TOWEL OR 17X26 10 PK STRL BLUE (TOWEL DISPOSABLE) ×2 IMPLANT

## 2016-08-21 NOTE — Anesthesia Postprocedure Evaluation (Signed)
Anesthesia Post Note  Patient: Joshua Myers  Procedure(s) Performed: Procedure(s) (LRB): ROTATOR CUFF REPAIR SHOULDER OPEN with graft (Left)  Patient location during evaluation: PACU Anesthesia Type: General Level of consciousness: awake and alert Pain management: pain level controlled Vital Signs Assessment: post-procedure vital signs reviewed and stable Respiratory status: spontaneous breathing, nonlabored ventilation, respiratory function stable and patient connected to nasal cannula oxygen Cardiovascular status: blood pressure returned to baseline and stable Postop Assessment: no signs of nausea or vomiting Anesthetic complications: no       Last Vitals:  Vitals:   08/21/16 1530 08/21/16 1545  BP: 112/76 118/77  Pulse: 93 88  Resp: 19 (!) 29  Temp:  36.6 C    Last Pain:  Vitals:   08/21/16 1545  TempSrc:   PainSc: 0-No pain                 Jajuan Skoog A.

## 2016-08-21 NOTE — Interval H&P Note (Signed)
History and Physical Interval Note:  08/21/2016 12:47 PM  Joshua Myers  has presented today for surgery, with the diagnosis of Left shoulder rotator cuff tear  The various methods of treatment have been discussed with the patient and family. After consideration of risks, benefits and other options for treatment, the patient has consented to  Procedure(s) with comments: [place] with possible graft and anchors (Left) - Need RNFA as a surgical intervention .  The patient's history has been reviewed, patient examined, no change in status, stable for surgery.  I have reviewed the patient's chart and labs.  Questions were answered to the patient's satisfaction.     Vernadine Coombs A

## 2016-08-21 NOTE — Anesthesia Preprocedure Evaluation (Signed)
Anesthesia Evaluation  Patient identified by MRN, date of birth, ID band Patient awake    Reviewed: Allergy & Precautions, NPO status , Patient's Chart, lab work & pertinent test results  Airway Mallampati: I  TM Distance: >3 FB Neck ROM: Full    Dental  (+) Caps   Pulmonary sleep apnea and Continuous Positive Airway Pressure Ventilation ,    Pulmonary exam normal breath sounds clear to auscultation       Cardiovascular hypertension, Pt. 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 Torn rotator cuff left shoulder   Abdominal (+) + obese,   Peds  Hematology   Anesthesia Other Findings   Reproductive/Obstetrics                             Anesthesia Physical Anesthesia Plan  ASA: III  Anesthesia Plan: General   Post-op Pain Management:  Regional for Post-op pain   Induction: Intravenous  Airway Management Planned: Oral ETT  Additional Equipment:   Intra-op Plan:   Post-operative Plan: Extubation in OR  Informed Consent: I have reviewed the patients History and Physical, chart, labs and discussed the procedure including the risks, benefits and alternatives for the proposed anesthesia with the patient or authorized representative who has indicated his/her understanding and acceptance.   Dental advisory given  Plan Discussed with: CRNA, Anesthesiologist and Surgeon  Anesthesia Plan Comments:         Anesthesia Quick Evaluation

## 2016-08-21 NOTE — Transfer of Care (Signed)
Immediate Anesthesia Transfer of Care Note  Patient: Joshua Myers  Procedure(s) Performed: Procedure(s) with comments: ROTATOR CUFF REPAIR SHOULDER OPEN with graft (Left) - Need RNFA  Patient Location: PACU  Anesthesia Type:General  Level of Consciousness:  sedated, patient cooperative and responds to stimulation  Airway & Oxygen Therapy:Patient Spontanous Breathing and Patient connected to face mask oxgen  Post-op Assessment:  Report given to PACU RN and Post -op Vital signs reviewed and stable  Post vital signs:  Reviewed and stable  Last Vitals:  Vitals:   08/21/16 1306 08/21/16 1308  BP: 121/85 124/87  Pulse: 78 82  Resp: 13 12  Temp:      Complications: No apparent anesthesia complications

## 2016-08-21 NOTE — H&P (Signed)
Joshua Myers is an 56 y.o. male.   Chief Complaint: Pain and limited motion of left shoulder. HPI: He injured his left shoulder at [facility] and has developed weakness of his ability to elevate his arm from his side.  Past Medical History:  Diagnosis Date  . History of hernia repair   . History of umbilical hernia repair   . Hypertension   . Sleep apnea     Past Surgical History:  Procedure Laterality Date  . CARPAL TUNNEL RELEASE    . HERNIA REPAIR     umb hernia  . SHOULDER SURGERY      Family History  Problem Relation Age of Onset  . Cancer Mother        lung  . Cancer Father        colon   Social History:  reports that he has never smoked. He quit smokeless tobacco use about 7 years ago. He reports that he does not drink alcohol or use drugs.  Allergies:  Allergies  Allergen Reactions  . Cephalexin Rash  . Other     Pt/wife thinks the allergy is to Cipro, but is unsure.  Caused bumps to come around lips    Medications Prior to Admission  Medication Sig Dispense Refill  . acetaminophen (TYLENOL) 325 MG tablet Take 650 mg by mouth every 6 (six) hours as needed for pain.    . Cholecalciferol (VITAMIN D) 2000 units tablet Take 2,000 Units by mouth daily.    . fish oil-omega-3 fatty acids 1000 MG capsule Take 2 g by mouth daily.    . fluticasone (FLONASE) 50 MCG/ACT nasal spray Place 2 sprays into both nostrils daily.    Marland Kitchen lisinopril (PRINIVIL,ZESTRIL) 40 MG tablet Take 20 mg by mouth daily.    . Misc Natural Products (HM JOINT HEALTH ULTRA PO) Take 30 mLs by mouth daily.    . Potassium Gluconate 550 MG TABS Take 2 tablets by mouth daily.    . SUPER B COMPLEX/C PO Take 1 tablet by mouth daily.    . TESTOSTERONE IM Inject 0.5 mLs into the muscle once a week.     . vitamin C (ASCORBIC ACID) 500 MG tablet Take 500 mg by mouth daily.      Results for orders placed or performed during the hospital encounter of 08/21/16 (from the past 48 hour(s))  Type and screen Order type  and screen if day of surgery is less than 15 days from draw of preadmission visit or order morning of surgery if day of surgery is greater than 6 days from preadmission visit.     Status: None   Collection Time: 08/21/16 11:32 AM  Result Value Ref Range   ABO/RH(D) A POS    Antibody Screen NEG    Sample Expiration 08/24/2016   ABO/Rh     Status: None   Collection Time: 08/21/16 11:32 AM  Result Value Ref Range   ABO/RH(D) A POS    No results found.  Review of Systems  Constitutional: Negative.   HENT: Negative.   Eyes: Negative.   Respiratory: Negative.   Cardiovascular: Negative.   Gastrointestinal: Negative.   Genitourinary: Negative.   Musculoskeletal: Positive for joint pain.  Skin: Negative.   Neurological: Negative.   Endo/Heme/Allergies: Negative.   Psychiatric/Behavioral: Negative.     Blood pressure 128/86, pulse 85, temperature 98.1 F (36.7 C), temperature source Oral, resp. rate 18, height 5\' 10"  (1.778 m), weight 128.8 kg (284 lb), SpO2 96 %. Physical Exam  Constitutional: He appears well-developed.  HENT:  Head: Normocephalic.  Eyes: Pupils are equal, round, and reactive to light.  Neck: Normal range of motion.  Cardiovascular: Normal rate.   Respiratory: Effort normal.  GI: Soft.  Musculoskeletal:  Marked restriction of abduction of Left Shoulder.  Neurological: He is alert.  Skin: Skin is warm.  Psychiatric: He has a normal mood and affect.     Assessment/Plan Openrepair of his Left shoulder and possible graft and anchor.  Tobi Bastos, MD 08/21/2016, 12:42 PM

## 2016-08-21 NOTE — Brief Op Note (Signed)
08/21/2016  2:45 PM  PATIENT:  Joshua Myers  56 y.o. male  PRE-OPERATIVE DIAGNOSIS:  Left shoulder rotator cuff tear,Complex,Complete,Retracted. Morbid obesity. Tear.  POST-OPERATIVE DIAGNOSIS: Same as Pre-Op  PROCEDURE:  Open Repair and Dermaspan Graft and acromionectomy Left Shoulder.He had a severe complex,retracted.tear. SURGEON:  Surgeon(s) and Role:    * Latanya Maudlin, MD - Primary  PHYSICIAN ASSISTANT: None  ASSISTANTS: OR Nurse.  ANESTHESIA:   general  EBL:  Total I/O In: -  Out: 20 [Blood:20]  BLOOD ADMINISTERED:none  DRAINS: none   LOCAL MEDICATIONS USED:  NONE  SPECIMEN:  No Specimen  DISPOSITION OF SPECIMEN:  N/A  COUNTS:  YES  TOURNIQUET:  * No tourniquets in log *  DICTATION: .Other Dictation: Dictation Number [id number]  PLAN OF CARE: Admit for overnight observation  PATIENT DISPOSITION:  PACU - hemodynamically stable.   Delay start of Pharmacological VTE agent (>24hrs) due to surgical blood loss or risk of bleeding: yes

## 2016-08-21 NOTE — Anesthesia Procedure Notes (Addendum)
Procedure Name: Intubation Date/Time: 08/21/2016 1:16 PM Performed by: Fred Hammes, Virgel Gess Pre-anesthesia Checklist: Patient identified, Emergency Drugs available, Suction available and Patient being monitored Patient Re-evaluated:Patient Re-evaluated prior to inductionOxygen Delivery Method: Circle system utilized Preoxygenation: Pre-oxygenation with 100% oxygen Intubation Type: IV induction Ventilation: Two handed mask ventilation required Laryngoscope Size: Mac and 4 Grade View: Grade I Tube size: 7.5 mm Number of attempts: 1 Airway Equipment and Method: Stylet Placement Confirmation: ETT inserted through vocal cords under direct vision,  positive ETCO2 and breath sounds checked- equal and bilateral Secured at: 23 cm Tube secured with: Tape Dental Injury: Teeth and Oropharynx as per pre-operative assessment

## 2016-08-21 NOTE — Progress Notes (Signed)
AssistedDr. Royce Macadamia with left, ultrasound guided, interscalene  block. Side rails up, monitors on throughout procedure. See vital signs in flow sheet. Tolerated Procedure well.

## 2016-08-21 NOTE — Op Note (Signed)
Joshua Myers, Joshua Myers               ACCOUNT NO.:  [id number]  MEDICAL RECORD NO.:  [id number]  LOCATION:                                 FACILITY:  PHYSICIAN:  Kipp Brood. Marji Kuehnel, M.D.DATE OF BIRTH:  17-Mar-1961  DATE OF PROCEDURE:  08/21/2016 DATE OF DISCHARGE:                              OPERATIVE REPORT   SURGEON:  Kipp Brood. Namir Neto, MD.  ASSISTANT:  The OR nurse.  PREOPERATIVE DIAGNOSES: 1. 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 2. Severe impingement syndrome, left shoulder. 3. Morbid obesity.  POSTOPERATIVE DIAGNOSES: 1. 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 2. Severe impingement syndrome, left shoulder. 3. Morbid obesity.  OPERATION: 1. Open acromionectomy, acromioplasty, left shoulder. 2. Repair of a complete retracted complex tear of the left rotator     cuff. 3. DermaSpan graft to the left shoulder.  DESCRIPTION OF PROCEDURE:  Under general anesthesia, routine orthopedic prep and draping of the left upper extremity and shoulder were carried out with the patient in the beach chair position.  The patient's appropriate time-out was first carried out, also marked the appropriate left shoulder in holding area.  At this time, the patient had 1 g of IV vancomycin.  The procedure under general anesthesia, routine incision was made over the anterior aspect of the shoulder.  Bleeders were identified and cauterized.  At this time, I then inserted self-retaining retractors.  The incision was carried down to the acromion.  I identified the acromion and I dissected by sharp dissection deltoid ligament.  I split a small proximal part of the deltoid muscle in the usual fashion.  Following this, I identified the acromion.  He had a large, thickened, downsloping acromion.  I protected the underlying cuff and then did a partial acromionectomy and acromioplasty utilizing the oscillating saw and a bur.  We thoroughly irrigated out the area.   I then noted the rotator cuff was severely retracted.  It was complete complex retracted tear.  I identified the lateral aspect of the humerus. I burred down the lateral side of the articular surface, developed some bleeding bone.  I then brought the tendon over and sutured it in the usual fashion for the repair.  I then applied a DermaSpan graft.  We had a nice repair.  I thoroughly irrigated out the area, reapproximated the deltoid tendon and muscle in usual fashion. Subcu was closed with #1 Vicryl suture.  Skin with metal staples.  Note, he had a large tattoo over the shoulder and tried my best to align the tattoo anatomically.  Sterile dressings were applied.  He was placed in a shoulder immobilizer.  I left the operating room in satisfactory condition.          ______________________________ Kipp Brood Gladstone Lighter, M.D.     RAG/MEDQ  D:  08/21/2016  T:  08/21/2016  Job:  [id number]

## 2016-08-21 NOTE — Anesthesia Procedure Notes (Signed)
Anesthesia Regional Block: Interscalene brachial plexus block   Pre-Anesthetic Checklist: ,, timeout performed, Correct Patient, Correct Site, Correct Laterality, Correct Procedure, Correct Position, site marked, Risks and benefits discussed,  Surgical consent,  Pre-op evaluation,  At surgeon's request and post-op pain management  Laterality: Left  Prep: chloraprep       Needles:  Injection technique: Single-shot  Needle Type: Echogenic Stimulator Needle     Needle Length: 9cm  Needle Gauge: 21     Additional Needles:   Procedures: ultrasound guided,,,,,,,,  Narrative:  Start time: 08/21/2016 12:54 PM End time: 08/21/2016 1:01 PM Injection made incrementally with aspirations every 5 mL.  Performed by: Personally  Anesthesiologist: Josephine Igo  Additional Notes: Timeout performed. Patient sedated. Relevant anatomy ID'd using [place]. Incremental 2-33ml of LA with frequent aspiration. Patient tolerated procedure well.

## 2016-08-22 ENCOUNTER — Encounter (HOSPITAL_COMMUNITY): Payer: Self-pay | Admitting: Orthopedic Surgery

## 2016-08-22 DIAGNOSIS — S46012A Strain of muscle(s) and tendon(s) of the rotator cuff of left shoulder, initial encounter: Secondary | ICD-10-CM | POA: Diagnosis not present

## 2016-08-22 MED ORDER — HYDROCODONE-ACETAMINOPHEN 5-325 MG PO TABS: 1.0000 | ORAL_TABLET | ORAL | 0 refills | Status: DC | PRN

## 2016-08-22 MED ORDER — POTASSIUM GLUCONATE 595 (99 K) MG PO TABS
1190.0000 mg | ORAL_TABLET | Freq: Every day | ORAL | Status: DC
  Administered 2016-08-22: 1190 mg via ORAL
  Filled 2016-08-22: qty 2

## 2016-08-22 MED ORDER — METHOCARBAMOL 500 MG PO TABS: 500.0000 mg | ORAL_TABLET | Freq: Four times a day (QID) | ORAL | 0 refills | Status: DC | PRN

## 2016-08-22 MED ORDER — POLYETHYLENE GLYCOL 3350 17 G PO PACK: 17.0000 g | PACK | Freq: Every day | ORAL | 0 refills | Status: AC | PRN

## 2016-08-22 MED ORDER — HYDROCODONE-ACETAMINOPHEN 5-325 MG PO TABS: 1.0000 | ORAL_TABLET | ORAL | 0 refills | Status: AC | PRN

## 2016-08-22 NOTE — Progress Notes (Signed)
   08/22/16 1200  OT Visit Information  Last OT Received On 08/22/16  Assistance Needed +1  History of Present Illness Pt was admitted for L repair of complete rotator cuff  Precautions  Precautions Shoulder  Type of Shoulder Precautions conservative protocol.  Sling on at all times except for bathing/dressing.    Shoulder Interventions Shoulder sling/immobilizer  Precaution Booklet Issued Yes (comment)  Pain Assessment  Pain Assessment Faces  Faces Pain Scale 2  Pain Location fingers  Pain Descriptors / Indicators Pins and needles  Pain Intervention(s) Limited activity within patient's tolerance;Monitored during session;Premedicated before session;Repositioned  Cognition  Arousal/Alertness Awake/alert  Behavior During Therapy WFL for tasks assessed/performed  Overall Cognitive Status Within Functional Limits for tasks assessed  ADL  General ADL Comments wife present.  Reviewed protocol and she states that she feels comfortable assisting pt.  Demonstrated donning sling:  pt verbalized and did not feel she needed to practice this  Restrictions  Weight Bearing Restrictions Yes  Other Position/Activity Restrictions NWB, LUE  OT - End of Session  Activity Tolerance Patient tolerated treatment well  Patient left in bed;with call bell/phone within reach  OT Assessment/Plan  Follow Up Recommendations DC plan and follow up therapy as arranged by surgeon  OT Equipment None recommended by OT  AM-PAC OT "6 Clicks" Daily Activity Outcome Measure  Help from another person eating meals? 3  Help from another person taking care of personal grooming? 3  Help from another person toileting, which includes using toliet, bedpan, or urinal? 2  Help from another person bathing (including washing, rinsing, drying)? 2  Help from another person to put on and taking off regular upper body clothing? 2  Help from another person to put on and taking off regular lower body clothing? 2  6 Click Score 14  ADL  G Code Conversion CK  OT Goal Progression  Progress towards OT goals Goals met/education completed, patient discharged from OT  OT Time Calculation  OT Start Time (ACUTE ONLY) 1100  OT Stop Time (ACUTE ONLY) 1113  OT Time Calculation (min) 13 min  OT G-codes **NOT FOR INPATIENT CLASS**  Self Care Discharge Status (J4970) CL  OT General Charges  $OT Visit 1 Procedure  OT Treatments  $Self Care/Home Management  8-22 mins  Lesle Chris, OTR/L [phone] 08/22/2016

## 2016-08-22 NOTE — Evaluation (Signed)
Occupational Therapy Evaluation Patient Details Name: Joshua Myers MRN: [id number] DOB: 1960-04-04 Today's Date: 08/22/2016    History of Present Illness Pt was admitted for L repair of complete rotator cuff   Clinical Impression   This 56 year old man was admitted for the above sx. Pt verbalizes understanding of all education; will return for one more visit to reinforce education with wife and ensure that she feels comfortable assisting him, following shoulder precautions.    Follow Up Recommendations  DC plan and follow up therapy as arranged by surgeon    Equipment Recommendations  None recommended by OT    Recommendations for Other Services       Precautions / Restrictions Precautions Precautions: Shoulder Type of Shoulder Precautions: conservative protocol.  Sling on at all times except for bathing/dressing.   Shoulder Interventions: Shoulder sling/immobilizer Precaution Booklet Issued: Yes (comment) Restrictions Weight Bearing Restrictions: Yes Other Position/Activity Restrictions: NWB      Mobility Bed Mobility Overal bed mobility: Independent                Transfers Overall transfer level: Independent                    Balance Overall balance assessment: No apparent balance deficits (not formally assessed)                                         ADL either performed or assessed with clinical judgement   ADL Overall ADL's : Needs assistance/impaired Eating/Feeding: Set up   Grooming: Set up;Sitting   Upper Body Bathing: Maximal assistance;Sitting   Lower Body Bathing: Moderate assistance;Sit to/from stand   Upper Body Dressing : Maximal assistance;Sitting   Lower Body Dressing: Maximal assistance;Sit to/from stand                 General ADL Comments: pt was able to stand and side step independently;  no LOB. Reviewed shoulder protocol, washed LUE, and adjusted sling for better fit.  Pt verbalizes  understanding of all.  Will plan to return when wife is present to review sling; ADLs; precautions. See education section of chart     Vision         Perception     Praxis      Pertinent Vitals/Pain Pain Assessment:  (block present)     Hand Dominance Right   Extremity/Trunk Assessment Upper Extremity Assessment Upper Extremity Assessment: LUE deficits/detail LUE Deficits / Details: immobilized.  Unable to move fingers at this time due to block           Communication Communication Communication: No difficulties   Cognition Arousal/Alertness: Awake/alert Behavior During Therapy: WFL for tasks assessed/performed Overall Cognitive Status: Within Functional Limits for tasks assessed                                     General Comments       Exercises     Shoulder Instructions      Home Living Family/patient expects to be discharged to:: Private residence Living Arrangements: Spouse/significant other Available Help at Discharge: Family               Bathroom Shower/Tub: Teacher, early years/pre: Standard  Prior Functioning/Environment Level of Independence: Independent                 OT Problem List: Decreased knowledge of precautions;Impaired UE functional use      OT Treatment/Interventions: Self-care/ADL training;Patient/family education    OT Goals(Current goals can be found in the care plan section) Acute Rehab OT Goals Patient Stated Goal: have a good outcome with LUE OT Goal Formulation: With patient Time For Goal Achievement: 08/23/16 Potential to Achieve Goals: Good ADL Goals Additional ADL Goal #1: wife will verbalize vs demonstrate application of sling, precautions during ADLs  OT Frequency: Min 2X/week   Barriers to D/C:            Co-evaluation              AM-PAC PT "6 Clicks" Daily Activity     Outcome Measure Help from another person eating meals?: A Little Help  from another person taking care of personal grooming?: A Little Help from another person toileting, which includes using toliet, bedpan, or urinal?: A Lot Help from another person bathing (including washing, rinsing, drying)?: A Lot Help from another person to put on and taking off regular upper body clothing?: A Lot Help from another person to put on and taking off regular lower body clothing?: A Lot 6 Click Score: 14   End of Session    Activity Tolerance: Patient tolerated treatment well Patient left: in bed;with call bell/phone within reach  OT Visit Diagnosis: Muscle weakness (generalized) (M62.81)                Time: 4166-0630 OT Time Calculation (min): 25 min Charges:  OT General Charges $OT Visit: 1 Procedure OT Evaluation $OT Eval Low Complexity: 1 Procedure OT Treatments $Self Care/Home Management : 8-22 mins G-Codes: OT G-codes **NOT FOR INPATIENT CLASS** Functional Assessment Tool Used: Clinical judgement Functional Limitation: Self care Self Care Current Status (Z6010): At least 60 percent but less than 80 percent impaired, limited or restricted Self Care Goal Status (X3235): At least 60 percent but less than 80 percent impaired, limited or restricted Self Care Discharge Status [phone]): At least 60 percent but less than 80 percent impaired, limited or restricted   Lesle Chris, OTR/L [phone] 08/22/2016  [name] 08/22/2016, 9:42 AM

## 2016-08-22 NOTE — Progress Notes (Signed)
Subjective: 1 Day Post-Op Procedure(s) (LRB): ROTATOR CUFF REPAIR SHOULDER OPEN with graft (Left) Patient reports pain as 1 on 0-10 scale. Doing very well. His nerve block is still very effective.   Objective: Vital signs in last 24 hours: Temp:  [97.5 F (36.4 C)-98.7 F (37.1 C)] 98.2 F (36.8 C) (05/30 1037) Pulse Rate:  [78-95] 90 (05/30 1037) Resp:  [12-30] 16 (05/30 1037) BP: (98-155)/(62-91) 155/79 (05/30 1037) SpO2:  [92 %-100 %] 95 % (05/30 1037)  Intake/Output from previous day: 05/29 0701 - 05/30 0700 In: 2701.7 [P.O.:960; I.V.:1541.7; IV Piggyback:200] Out: 2245 [Urine:2225; Blood:20] Intake/Output this shift: Total I/O In: 240 [P.O.:240] Out: 1425 [Urine:1425]  No results for input(s): HGB in the last 72 hours. No results for input(s): WBC, RBC, HCT, PLT in the last 72 hours. No results for input(s): NA, K, CL, CO2, BUN, CREATININE, GLUCOSE, CALCIUM in the last 72 hours. No results for input(s): LABPT, INR in the last 72 hours.  No cellulitis present  Assessment/Plan: 1 Day Post-Op Procedure(s) (LRB): ROTATOR CUFF REPAIR SHOULDER OPEN with graft (Left) Disp[osition: DC today with sling. Office Thursday.  Abree Romick A 08/22/2016, 12:12 PM

## 2016-08-25 NOTE — Discharge Summary (Signed)
   Physician Discharge Summary  Patient ID: Joshua Myers MRN: [id number] DOB/AGE: 07/16/1960 56 y.o.  Admit date: 08/21/2016 Discharge date: 08/22/2016   Procedures:  Procedure(s) (LRB): ROTATOR CUFF REPAIR SHOULDER OPEN with graft (Left)  Attending Physician:  Dr. Paralee Cancel   Admission Diagnoses:   Pain and limited motion of left shoulder  Discharge Diagnoses:  Active Problems:   Complete tear of right rotator cuff  Past Medical History:  Diagnosis Date  . History of hernia repair   . History of umbilical hernia repair   . Hypertension   . Sleep apnea     HPI:    He injured his left shoulder at work and has developed weakness of his ability to elevate his arm from his side.  PCP: Lawerance Cruel, MD   Discharged Condition: good  Hospital Course:  Patient underwent the above stated procedure on 08/21/2016. Patient tolerated the procedure well and brought to the recovery room in good condition and subsequently to the floor.  POD #1 BP: 155/79 ; Pulse: 90 ; Temp: 98.2 F (36.8 C) ; Resp: 16 Patient reports pain as 1 on 0-10 scale. Doing very well. His nerve block is still very effective.   LABS   No new labs  Discharge Exam: General appearance: alert, cooperative and no distress  Disposition: Home with follow up in 2 weeks   Follow-up Information    Latanya Maudlin, MD. Schedule an appointment as soon as possible for a visit on 09/06/2016.   Specialty:  Orthopedic Surgery Contact information: [address] [place] [address] [phone]             Allergies as of 08/22/2016      Reactions   Cephalexin Rash   Other    Pt/wife thinks the allergy is to Cipro, but is unsure.  Caused bumps to come around lips      Medication List    STOP taking these medications   acetaminophen 325 MG tablet Commonly known as:  TYLENOL     TAKE these medications   fish oil-omega-3 fatty acids 1000 MG capsule Take 2 g by mouth daily.   fluticasone 50 MCG/ACT nasal spray Commonly known as:  FLONASE Place 2 sprays into both nostrils daily.   HM JOINT HEALTH ULTRA PO Take 30 mLs by mouth daily.   HYDROcodone-acetaminophen 5-325 MG tablet Commonly known as:  NORCO/VICODIN Take 1-2 tablets by mouth every 4 (four) hours as needed (breakthrough pain).   lisinopril 40 MG tablet Commonly known as:  PRINIVIL,ZESTRIL Take 20 mg by mouth daily.   methocarbamol 500 MG tablet Commonly known as:  ROBAXIN Take 1 tablet (500 mg total) by mouth every 6 (six) hours as needed for muscle spasms.   polyethylene glycol packet Commonly known as:  MIRALAX / GLYCOLAX Take 17 g by mouth daily as needed for mild constipation.   Potassium Gluconate 550 MG Tabs Take 2 tablets by mouth daily.   SUPER B COMPLEX/C PO Take 1 tablet by mouth daily.   TESTOSTERONE IM Inject 0.5 mLs into the muscle once a week.   vitamin C 500 MG tablet Commonly known as:  ASCORBIC ACID Take 500 mg by mouth daily.   Vitamin D 2000 units tablet Take 2,000 Units by mouth daily.        Signed: [place]. Anaiz Qazi   PA-C  08/25/2016, 8:46 AM

## 2016-08-28 MED FILL — Medication: Qty: 4 | Status: AC

## 2016-09-19 DIAGNOSIS — D229 Melanocytic nevi, unspecified: Secondary | ICD-10-CM | POA: Diagnosis not present

## 2016-09-19 DIAGNOSIS — L84 Corns and callosities: Secondary | ICD-10-CM | POA: Diagnosis not present

## 2017-01-09 DIAGNOSIS — E291 Testicular hypofunction: Secondary | ICD-10-CM | POA: Diagnosis not present

## 2017-01-09 DIAGNOSIS — Z125 Encounter for screening for malignant neoplasm of prostate: Secondary | ICD-10-CM | POA: Diagnosis not present

## 2017-01-30 DIAGNOSIS — M79662 Pain in left lower leg: Secondary | ICD-10-CM | POA: Diagnosis not present

## 2017-01-30 DIAGNOSIS — M25562 Pain in left knee: Secondary | ICD-10-CM | POA: Diagnosis not present

## 2017-01-31 DIAGNOSIS — M1712 Unilateral primary osteoarthritis, left knee: Secondary | ICD-10-CM | POA: Diagnosis not present

## 2017-02-16 DIAGNOSIS — M25562 Pain in left knee: Secondary | ICD-10-CM | POA: Diagnosis not present

## 2017-02-21 DIAGNOSIS — S83242A Other tear of medial meniscus, current injury, left knee, initial encounter: Secondary | ICD-10-CM | POA: Diagnosis not present

## 2017-03-05 DIAGNOSIS — R109 Unspecified abdominal pain: Secondary | ICD-10-CM | POA: Diagnosis not present

## 2017-03-08 DIAGNOSIS — M23362 Other meniscus derangements, other lateral meniscus, left knee: Secondary | ICD-10-CM | POA: Diagnosis not present

## 2017-03-08 DIAGNOSIS — M2342 Loose body in knee, left knee: Secondary | ICD-10-CM | POA: Diagnosis not present

## 2017-03-08 DIAGNOSIS — S83282A Other tear of lateral meniscus, current injury, left knee, initial encounter: Secondary | ICD-10-CM | POA: Diagnosis not present

## 2017-03-08 DIAGNOSIS — M23322 Other meniscus derangements, posterior horn of medial meniscus, left knee: Secondary | ICD-10-CM | POA: Diagnosis not present

## 2017-03-08 DIAGNOSIS — G8918 Other acute postprocedural pain: Secondary | ICD-10-CM | POA: Diagnosis not present

## 2017-03-08 DIAGNOSIS — M94262 Chondromalacia, left knee: Secondary | ICD-10-CM | POA: Diagnosis not present

## 2017-03-08 DIAGNOSIS — S83242A Other tear of medial meniscus, current injury, left knee, initial encounter: Secondary | ICD-10-CM | POA: Diagnosis not present

## 2017-03-14 DIAGNOSIS — M25662 Stiffness of left knee, not elsewhere classified: Secondary | ICD-10-CM | POA: Diagnosis not present

## 2017-03-14 DIAGNOSIS — Z9889 Other specified postprocedural states: Secondary | ICD-10-CM | POA: Diagnosis not present

## 2017-03-14 DIAGNOSIS — M25562 Pain in left knee: Secondary | ICD-10-CM | POA: Diagnosis not present

## 2017-04-04 DIAGNOSIS — M25562 Pain in left knee: Secondary | ICD-10-CM | POA: Diagnosis not present

## 2017-04-30 DIAGNOSIS — Z9889 Other specified postprocedural states: Secondary | ICD-10-CM | POA: Diagnosis not present

## 2017-05-17 DIAGNOSIS — S46111A Strain of muscle, fascia and tendon of long head of biceps, right arm, initial encounter: Secondary | ICD-10-CM | POA: Diagnosis not present

## 2017-06-12 DIAGNOSIS — M79601 Pain in right arm: Secondary | ICD-10-CM | POA: Diagnosis not present

## 2017-06-18 DIAGNOSIS — S46291A Other injury of muscle, fascia and tendon of other parts of biceps, right arm, initial encounter: Secondary | ICD-10-CM | POA: Diagnosis not present

## 2017-06-18 DIAGNOSIS — S46211A Strain of muscle, fascia and tendon of other parts of biceps, right arm, initial encounter: Secondary | ICD-10-CM | POA: Diagnosis not present

## 2017-07-01 DIAGNOSIS — M9902 Segmental and somatic dysfunction of thoracic region: Secondary | ICD-10-CM | POA: Diagnosis not present

## 2017-07-01 DIAGNOSIS — M9901 Segmental and somatic dysfunction of cervical region: Secondary | ICD-10-CM | POA: Diagnosis not present

## 2017-07-01 DIAGNOSIS — M5031 Other cervical disc degeneration,  high cervical region: Secondary | ICD-10-CM | POA: Diagnosis not present

## 2017-07-01 DIAGNOSIS — M9907 Segmental and somatic dysfunction of upper extremity: Secondary | ICD-10-CM | POA: Diagnosis not present

## 2017-07-03 DIAGNOSIS — Z4889 Encounter for other specified surgical aftercare: Secondary | ICD-10-CM | POA: Diagnosis not present

## 2017-07-03 DIAGNOSIS — M9907 Segmental and somatic dysfunction of upper extremity: Secondary | ICD-10-CM | POA: Diagnosis not present

## 2017-07-03 DIAGNOSIS — M79601 Pain in right arm: Secondary | ICD-10-CM | POA: Diagnosis not present

## 2017-07-03 DIAGNOSIS — M5031 Other cervical disc degeneration,  high cervical region: Secondary | ICD-10-CM | POA: Diagnosis not present

## 2017-07-03 DIAGNOSIS — M9902 Segmental and somatic dysfunction of thoracic region: Secondary | ICD-10-CM | POA: Diagnosis not present

## 2017-07-03 DIAGNOSIS — M9901 Segmental and somatic dysfunction of cervical region: Secondary | ICD-10-CM | POA: Diagnosis not present

## 2017-07-04 DIAGNOSIS — M9907 Segmental and somatic dysfunction of upper extremity: Secondary | ICD-10-CM | POA: Diagnosis not present

## 2017-07-04 DIAGNOSIS — M9902 Segmental and somatic dysfunction of thoracic region: Secondary | ICD-10-CM | POA: Diagnosis not present

## 2017-07-04 DIAGNOSIS — M9901 Segmental and somatic dysfunction of cervical region: Secondary | ICD-10-CM | POA: Diagnosis not present

## 2017-07-04 DIAGNOSIS — M5031 Other cervical disc degeneration,  high cervical region: Secondary | ICD-10-CM | POA: Diagnosis not present

## 2017-07-09 DIAGNOSIS — M25629 Stiffness of unspecified elbow, not elsewhere classified: Secondary | ICD-10-CM | POA: Diagnosis not present

## 2017-07-16 DIAGNOSIS — M25629 Stiffness of unspecified elbow, not elsewhere classified: Secondary | ICD-10-CM | POA: Diagnosis not present

## 2017-07-18 DIAGNOSIS — G4733 Obstructive sleep apnea (adult) (pediatric): Secondary | ICD-10-CM | POA: Diagnosis not present

## 2017-07-23 DIAGNOSIS — M9907 Segmental and somatic dysfunction of upper extremity: Secondary | ICD-10-CM | POA: Diagnosis not present

## 2017-07-23 DIAGNOSIS — M5031 Other cervical disc degeneration,  high cervical region: Secondary | ICD-10-CM | POA: Diagnosis not present

## 2017-07-23 DIAGNOSIS — M9902 Segmental and somatic dysfunction of thoracic region: Secondary | ICD-10-CM | POA: Diagnosis not present

## 2017-07-23 DIAGNOSIS — M9901 Segmental and somatic dysfunction of cervical region: Secondary | ICD-10-CM | POA: Diagnosis not present

## 2017-07-24 DIAGNOSIS — M25629 Stiffness of unspecified elbow, not elsewhere classified: Secondary | ICD-10-CM | POA: Diagnosis not present

## 2017-08-12 DIAGNOSIS — M25621 Stiffness of right elbow, not elsewhere classified: Secondary | ICD-10-CM | POA: Diagnosis not present

## 2017-08-14 DIAGNOSIS — Z125 Encounter for screening for malignant neoplasm of prostate: Secondary | ICD-10-CM | POA: Diagnosis not present

## 2017-08-14 DIAGNOSIS — E291 Testicular hypofunction: Secondary | ICD-10-CM | POA: Diagnosis not present

## 2017-08-20 DIAGNOSIS — E291 Testicular hypofunction: Secondary | ICD-10-CM | POA: Diagnosis not present

## 2017-08-27 DIAGNOSIS — M25621 Stiffness of right elbow, not elsewhere classified: Secondary | ICD-10-CM | POA: Diagnosis not present

## 2017-09-13 DIAGNOSIS — M25629 Stiffness of unspecified elbow, not elsewhere classified: Secondary | ICD-10-CM | POA: Diagnosis not present

## 2017-09-16 DIAGNOSIS — M9907 Segmental and somatic dysfunction of upper extremity: Secondary | ICD-10-CM | POA: Diagnosis not present

## 2017-09-16 DIAGNOSIS — M9902 Segmental and somatic dysfunction of thoracic region: Secondary | ICD-10-CM | POA: Diagnosis not present

## 2017-09-16 DIAGNOSIS — M9901 Segmental and somatic dysfunction of cervical region: Secondary | ICD-10-CM | POA: Diagnosis not present

## 2017-09-16 DIAGNOSIS — M5031 Other cervical disc degeneration,  high cervical region: Secondary | ICD-10-CM | POA: Diagnosis not present

## 2017-10-14 DIAGNOSIS — M9907 Segmental and somatic dysfunction of upper extremity: Secondary | ICD-10-CM | POA: Diagnosis not present

## 2017-10-14 DIAGNOSIS — M9901 Segmental and somatic dysfunction of cervical region: Secondary | ICD-10-CM | POA: Diagnosis not present

## 2017-10-14 DIAGNOSIS — M5031 Other cervical disc degeneration,  high cervical region: Secondary | ICD-10-CM | POA: Diagnosis not present

## 2017-10-14 DIAGNOSIS — M9902 Segmental and somatic dysfunction of thoracic region: Secondary | ICD-10-CM | POA: Diagnosis not present

## 2017-10-16 DIAGNOSIS — M5031 Other cervical disc degeneration,  high cervical region: Secondary | ICD-10-CM | POA: Diagnosis not present

## 2017-10-16 DIAGNOSIS — M9901 Segmental and somatic dysfunction of cervical region: Secondary | ICD-10-CM | POA: Diagnosis not present

## 2017-10-16 DIAGNOSIS — M9907 Segmental and somatic dysfunction of upper extremity: Secondary | ICD-10-CM | POA: Diagnosis not present

## 2017-10-16 DIAGNOSIS — M9902 Segmental and somatic dysfunction of thoracic region: Secondary | ICD-10-CM | POA: Diagnosis not present

## 2017-10-22 DIAGNOSIS — M9907 Segmental and somatic dysfunction of upper extremity: Secondary | ICD-10-CM | POA: Diagnosis not present

## 2017-10-22 DIAGNOSIS — M5031 Other cervical disc degeneration,  high cervical region: Secondary | ICD-10-CM | POA: Diagnosis not present

## 2017-10-22 DIAGNOSIS — M9902 Segmental and somatic dysfunction of thoracic region: Secondary | ICD-10-CM | POA: Diagnosis not present

## 2017-10-22 DIAGNOSIS — M9901 Segmental and somatic dysfunction of cervical region: Secondary | ICD-10-CM | POA: Diagnosis not present

## 2017-11-12 DIAGNOSIS — M9907 Segmental and somatic dysfunction of upper extremity: Secondary | ICD-10-CM | POA: Diagnosis not present

## 2017-11-12 DIAGNOSIS — M5031 Other cervical disc degeneration,  high cervical region: Secondary | ICD-10-CM | POA: Diagnosis not present

## 2017-11-12 DIAGNOSIS — M9902 Segmental and somatic dysfunction of thoracic region: Secondary | ICD-10-CM | POA: Diagnosis not present

## 2017-11-12 DIAGNOSIS — M9901 Segmental and somatic dysfunction of cervical region: Secondary | ICD-10-CM | POA: Diagnosis not present

## 2017-11-14 DIAGNOSIS — M5031 Other cervical disc degeneration,  high cervical region: Secondary | ICD-10-CM | POA: Diagnosis not present

## 2017-11-14 DIAGNOSIS — M9907 Segmental and somatic dysfunction of upper extremity: Secondary | ICD-10-CM | POA: Diagnosis not present

## 2017-11-14 DIAGNOSIS — M9901 Segmental and somatic dysfunction of cervical region: Secondary | ICD-10-CM | POA: Diagnosis not present

## 2017-11-14 DIAGNOSIS — M9902 Segmental and somatic dysfunction of thoracic region: Secondary | ICD-10-CM | POA: Diagnosis not present

## 2017-11-18 DIAGNOSIS — M9907 Segmental and somatic dysfunction of upper extremity: Secondary | ICD-10-CM | POA: Diagnosis not present

## 2017-11-18 DIAGNOSIS — M9901 Segmental and somatic dysfunction of cervical region: Secondary | ICD-10-CM | POA: Diagnosis not present

## 2017-11-18 DIAGNOSIS — M5031 Other cervical disc degeneration,  high cervical region: Secondary | ICD-10-CM | POA: Diagnosis not present

## 2017-11-18 DIAGNOSIS — M9902 Segmental and somatic dysfunction of thoracic region: Secondary | ICD-10-CM | POA: Diagnosis not present

## 2017-11-20 DIAGNOSIS — M9902 Segmental and somatic dysfunction of thoracic region: Secondary | ICD-10-CM | POA: Diagnosis not present

## 2017-11-20 DIAGNOSIS — M9907 Segmental and somatic dysfunction of upper extremity: Secondary | ICD-10-CM | POA: Diagnosis not present

## 2017-11-20 DIAGNOSIS — M9901 Segmental and somatic dysfunction of cervical region: Secondary | ICD-10-CM | POA: Diagnosis not present

## 2017-11-20 DIAGNOSIS — M5031 Other cervical disc degeneration,  high cervical region: Secondary | ICD-10-CM | POA: Diagnosis not present

## 2017-11-21 DIAGNOSIS — M5031 Other cervical disc degeneration,  high cervical region: Secondary | ICD-10-CM | POA: Diagnosis not present

## 2017-11-21 DIAGNOSIS — M9907 Segmental and somatic dysfunction of upper extremity: Secondary | ICD-10-CM | POA: Diagnosis not present

## 2017-11-21 DIAGNOSIS — M9902 Segmental and somatic dysfunction of thoracic region: Secondary | ICD-10-CM | POA: Diagnosis not present

## 2017-11-21 DIAGNOSIS — M9901 Segmental and somatic dysfunction of cervical region: Secondary | ICD-10-CM | POA: Diagnosis not present

## 2017-11-22 ENCOUNTER — Other Ambulatory Visit: Payer: Self-pay

## 2017-11-22 ENCOUNTER — Emergency Department (HOSPITAL_COMMUNITY)
Admission: EM | Admit: 2017-11-22 | Discharge: 2017-11-22 | Disposition: A | Discharge status: Home / Self Care | Payer: BLUE CROSS/BLUE SHIELD | Attending: Emergency Medicine | Admitting: Emergency Medicine

## 2017-11-22 ENCOUNTER — Encounter (HOSPITAL_COMMUNITY): Payer: Self-pay

## 2017-11-22 ENCOUNTER — Emergency Department (HOSPITAL_COMMUNITY): Payer: BLUE CROSS/BLUE SHIELD

## 2017-11-22 DIAGNOSIS — I10 Essential (primary) hypertension: Secondary | ICD-10-CM | POA: Diagnosis not present

## 2017-11-22 DIAGNOSIS — Z87891 Personal history of nicotine dependence: Secondary | ICD-10-CM | POA: Diagnosis not present

## 2017-11-22 DIAGNOSIS — R0902 Hypoxemia: Secondary | ICD-10-CM | POA: Diagnosis not present

## 2017-11-22 DIAGNOSIS — Z79899 Other long term (current) drug therapy: Secondary | ICD-10-CM | POA: Diagnosis not present

## 2017-11-22 DIAGNOSIS — M5441 Lumbago with sciatica, right side: Secondary | ICD-10-CM | POA: Insufficient documentation

## 2017-11-22 DIAGNOSIS — M545 Low back pain: Secondary | ICD-10-CM | POA: Diagnosis not present

## 2017-11-22 DIAGNOSIS — I959 Hypotension, unspecified: Secondary | ICD-10-CM | POA: Diagnosis not present

## 2017-11-22 MED ORDER — NAPROXEN 500 MG PO TABS: 500.0000 mg | ORAL_TABLET | Freq: Two times a day (BID) | ORAL | 0 refills | Status: AC

## 2017-11-22 MED ORDER — NAPROXEN 250 MG PO TABS
500.0000 mg | ORAL_TABLET | Freq: Once | ORAL | Status: AC
  Administered 2017-11-22: 500 mg via ORAL
  Filled 2017-11-22: qty 2

## 2017-11-22 MED ORDER — METHOCARBAMOL 500 MG PO TABS
500.0000 mg | ORAL_TABLET | Freq: Once | ORAL | Status: AC
  Administered 2017-11-22: 500 mg via ORAL
  Filled 2017-11-22: qty 1

## 2017-11-22 MED ORDER — METHOCARBAMOL 500 MG PO TABS: 500.0000 mg | ORAL_TABLET | Freq: Two times a day (BID) | ORAL | 0 refills | Status: AC

## 2017-11-22 NOTE — ED Triage Notes (Signed)
Pt arrived via [facility] from home with c/o 3-5 days of back pain in right lower side radiating into right buttocks. Pt states he took some ibuprofen with minimal relief. Has been using ice and saw chiropractor.

## 2017-11-22 NOTE — Discharge Instructions (Addendum)
Please return to the Emergency Department for any new or worsening symptoms or if your symptoms do not improve. Please be sure to follow up with your Primary Care Physician as soon as possible regarding your visit today. If you do not have a Primary Doctor please use the resources below to establish one. Please follow-up with the neurosurgeon's office as soon as possible for further evaluation. You may use the naproxen as prescribed.  Please drink plenty of water and take this medication with food. You may use the muscle relaxer Robaxin as prescribed.  Please do not drive or operate machinery will take this medication.  Contact a health care provider if: You have pain that is not relieved with rest or medicine. You have increasing pain going down into your legs or buttocks. Your pain does not improve in 2 weeks. You have pain at night. You lose weight. You have a fever or chills. Get help right away if: You develop new bowel or bladder control problems. You have unusual weakness or numbness in your arms or legs. You develop nausea or vomiting. You develop abdominal pain. You feel faint.  RESOURCE GUIDE  Chronic Pain Problems: Contact [facility]  [phone] Patients need to be referred by their primary care doctor.  Insufficient Money for Medicine: Contact [employer]:  call "211" or [place] [phone].  No Primary Care Doctor: Call [facility]  [phone] - can help you locate a primary care doctor that  accepts your insurance, provides certain services, etc. Physician Referral Service- [phone]  Agencies that provide inexpensive medical care: [facility]  [facility]  [phone] [facility]  [phone] [facility]  [phone] [facility]  [phone] [facility]  [phone]  [place] Providers: [facility]- [address]  Dr, [address]  [phone], Mon-Fri 9am-7pm, Sat 9am-1pm [place], [address] [place]  [place], [address] [place]  [place]- [address]  [place], [address], [phone]  Only accepts [place] patients after they have their name  applied to their card  Self Pay (no insurance) in [facility]: Sickle Cell Patients: Dr Kevan Ny, [facility]  [place], [facility]- [place]  [facility] [place]- [address], [facility]- see information above (Speak to [employer] if you do not have insurance)       -  [facility]- [place], [place],  [place], Elleen Coulibaly  Dr [place] Lawman-  [address], [place], [place] [facility]- [address] [phone]       -  [facility] - [address], [place], also [address], [phone]       -    [facility]- [place], [place], 1st & 3rd Saturday  every month, 10am-1pm  1) Find a Doctor and Pay Out of Pocket Although you won't have to find out who is covered by your insurance plan, it is a good idea to ask around and get recommendations. You will then need to call the office and see if the doctor you have chosen will accept you as a new patient and what types of options they offer for patients who are self-pay. Some doctors offer discounts or will set up payment plans for their patients who do not have insurance, but you will need to ask so you aren't surprised when you get to your appointment.  2) Contact Your Local Health Department Not all health departments have  doctors that can see patients for sick visits, but many do, so it is worth a call to see if yours does. If you don't know where your local health department is, you can check in your phone book. The CDC also has a tool to help you locate your state's health department, and many state websites also have listings of all of their local health departments.  3) Find a [facility] If your illness is not likely to be very severe or complicated, you may want to try a walk in clinic. These are popping up all over the country in pharmacies, drugstores, and shopping centers. They're usually staffed by nurse practitioners or physician assistants that have been trained to treat common illnesses and complaints. They're usually fairly quick and inexpensive. However, if you have serious medical issues or chronic medical problems, these are probably not your best option  [facility] [place], [facility], [address], [place], phone [phone] or [phone].  Monday - Friday, call for an appointment. [place], [facility], [address], [place], phone [phone] or [phone].  Monday - Friday, call for an appointment.  Abuse/Neglect: [place] [phone] [place] [phone] (After Hours)  Emergency Shelter:  [facility] [phone]  Maternity Homes: Room at the [place] [phone] [place] [phone]  MRSA Hotline #:   970-751-5749  [facility] Dept. [address]         [place] Phone:  [phone]                                  Phone:  [phone]                    Phone:  [place], [place] in  Linna Hoff [address]                                  [phone], [place] [phone] or [phone] (After Hours)   [place]  Substance Abuse Resources: Alcohol and Drug Services  [phone] [place] [phone] [facility] [place] Chinita Pester [phone] Residential & Outpatient Substance Abuse Program  [phone]  Psychological Services: [place]  [phone] [place]  [phone] [facility], [place] [address] [place], [place]: [phone] or [phone], [employer]  Dental Assistance  If unable to pay or uninsured, contact:  [facility] or [facility]. to become qualified for the adult dental clinic.  Patients with Medicaid: [facility] [phone] [address], [place] [address] [phone]  If unable to pay, or uninsured, contact [facility] [phone]) or [place] [phone] in [place], [place] in [facility]) to become qualified for the adult dental clinic   Other [place]- [place], [place], [place], [address], [place], [place], 2nd and 4th Thursday of the month at 6:30am.  10 clients each day by appointment, can sometimes see walk-in patients if someone does not show for an appointment. [facility]- [address] Hillard Danker [place], [place], [address], [place], [place], [place], [address], [place] Department- [phone] [place] [facility] [phone]

## 2017-11-22 NOTE — ED Provider Notes (Signed)
Patient placed in Quick Look pathway, seen and evaluated   Chief Complaint: back pain  HPI:   Joshua Myers is a 57 y.o. male who presents to the ED via EMS with low back pain. Patient reports he has been seeing his chiropractor and it usually helps but not this time. The pain is located in the lower back and radiates to the right buttock. Patient reports taking ibuprofen with minimal relief. Patient reports no pain until he starts to walk. Patient worked all day yesterday. Works as a Building control surveyor.   ROS: M/S: back pain  Physical Exam:  BP 125/75 (BP Location: Right Arm)   Pulse 74   Temp 99 F (37.2 C) (Oral)   Resp 20   Ht 5\' 10"  (1.778 m)   Wt 127 kg   SpO2 96%   BMI 40.18 kg/m     Gen: No distress  Neuro: Awake and Alert  Skin: Warm and dry  M/S: unable to reproduce the pain with palpation    Initiation of care has begun. The patient has been counseled on the process, plan, and necessity for staying for the completion/evaluation, and the remainder of the medical screening examination    Ashley Murrain, NP 11/22/17 [place], MD 12/09/17 1358

## 2017-11-22 NOTE — ED Provider Notes (Addendum)
[facility] [place] EMERGENCY DEPARTMENT Provider Note   CSN: [id number] Arrival date & time: 11/22/17  1519     History   Chief Complaint Chief Complaint  Patient presents with  . Back Pain    HPI Joshua Myers is a 57 y.o. male presenting for lower back pain.  Patient states that he has had chronic lower back pain but has worsened in the past 1 week.  Patient states that the pain is a throbbing in his lower back that radiates to his right glutes.  Patient states that walking and movement make the pain worse.  He states that he has taken ibuprofen with some relief.  Patient denies numbness or weakness to his extremities patient denies bowel or bladder incontinence.  Patient denies fever or IV drug use.  Patient denies new injury or falls.  HPI  Past Medical History:  Diagnosis Date  . History of hernia repair   . History of umbilical hernia repair   . Hypertension   . Sleep apnea     Patient Active Problem List   Diagnosis Date Noted  . Complete tear of right rotator cuff 08/21/2016  . Umbilical pain 45/80/9983    Past Surgical History:  Procedure Laterality Date  . CARPAL TUNNEL RELEASE    . HERNIA REPAIR     umb hernia  . SHOULDER OPEN ROTATOR CUFF REPAIR Left 08/21/2016   Procedure: ROTATOR CUFF REPAIR SHOULDER OPEN with graft;  Surgeon: Latanya Maudlin, MD;  Location: [facility];  Service: Orthopedics;  Laterality: Left;  Need RNFA  . SHOULDER SURGERY          Home Medications    Prior to Admission medications   Medication Sig Start Date End Date Taking? Authorizing Provider  Cholecalciferol (VITAMIN D) 2000 units tablet Take 2,000 Units by mouth daily.    [provider]  fish oil-omega-3 fatty acids 1000 MG capsule Take 2 g by mouth daily.    [provider]  fluticasone (FLONASE) 50 MCG/ACT nasal spray Place 2 sprays into both nostrils daily.    [provider]  HYDROcodone-acetaminophen (NORCO/VICODIN) 5-325 MG tablet  Take 1-2 tablets by mouth every 4 (four) hours as needed (breakthrough pain). 08/22/16   Danae Orleans, PA-C  lisinopril (PRINIVIL,ZESTRIL) 40 MG tablet Take 20 mg by mouth daily.    [provider]  methocarbamol (ROBAXIN) 500 MG tablet Take 1 tablet (500 mg total) by mouth 2 (two) times daily. 11/22/17   Nuala Alpha A, PA-C  Misc Natural Products (HM JOINT HEALTH ULTRA PO) Take 30 mLs by mouth daily.    [provider]  naproxen (NAPROSYN) 500 MG tablet Take 1 tablet (500 mg total) by mouth 2 (two) times daily. 11/22/17   Nuala Alpha A, PA-C  polyethylene glycol (MIRALAX / GLYCOLAX) packet Take 17 g by mouth daily as needed for mild constipation. 08/22/16   Danae Orleans, PA-C  Potassium Gluconate 550 MG TABS Take 2 tablets by mouth daily.    [provider]  SUPER B COMPLEX/C PO Take 1 tablet by mouth daily.    [provider]  TESTOSTERONE IM Inject 0.5 mLs into the muscle once a week.     [provider]  vitamin C (ASCORBIC ACID) 500 MG tablet Take 500 mg by mouth daily.    [provider]    Family History Family History  Problem Relation Age of Onset  . Cancer Mother        lung  .  Cancer Father        colon    Social History Social History   Tobacco Use  . Smoking status: Never Smoker  . Smokeless tobacco: Former Network engineer Use Topics  . Alcohol use: No  . Drug use: No     Allergies   Cephalexin and Other   Review of Systems Review of Systems  Constitutional: Negative.  Negative for chills, fatigue and fever.  Gastrointestinal: Negative.  Negative for abdominal pain.  Musculoskeletal: Positive for back pain. Negative for neck pain.  Skin: Negative.  Negative for rash and wound.  Neurological: Negative.  Negative for dizziness, weakness and numbness.     Physical Exam Updated Vital Signs BP 125/75 (BP Location: Right Arm)   Pulse 74   Temp 99 F (37.2 C) (Oral)   Resp 20   Ht 5\' 10"   (1.778 m)   Wt 127 kg   SpO2 96%   BMI 40.18 kg/m   Physical Exam  Constitutional: He is oriented to person, place, and time. He appears well-developed and well-nourished. No distress.  HENT:  Head: Normocephalic and atraumatic.  Right Ear: External ear normal.  Left Ear: External ear normal.  Nose: Nose normal.  Eyes: Pupils are equal, round, and reactive to light. EOM are normal.  Neck: Trachea normal, normal range of motion and full passive range of motion without pain. Neck supple. No tracheal deviation present.  Cardiovascular:  Pulses:      Dorsalis pedis pulses are 2+ on the right side, and 2+ on the left side.       Posterior tibial pulses are 2+ on the right side, and 2+ on the left side.  Pulmonary/Chest: Effort normal. No respiratory distress.  Abdominal: Soft. There is no tenderness. There is no rebound and no guarding.  Musculoskeletal: Normal range of motion.       Cervical back: Normal.       Thoracic back: Normal.       Lumbar back: He exhibits tenderness.       Back:  No thoracic or cervical midline spinal tenderness to palpation, no paraspinal muscle tenderness, no deformity, crepitus, or step-off noted.  There is a small lump to the midlumbar spine, not tender unsure of significance will obtain x-ray to evaluate. No overlying erythema, fluctuance or induration. No increased warmth. Does not appear infectious or traumatic. Consistence of adipose tissue.  No midline lumbar spinal tenderness to palpation, there is paraspinal muscle tenderness to palpation.  There is no crepitus or midline tenderness.   Feet:  Right Foot:  Protective Sensation: 3 sites tested. 3 sites sensed.  Left Foot:  Protective Sensation: 3 sites tested. 3 sites sensed.  Neurological: He is alert and oriented to person, place, and time. He has normal strength. No cranial nerve deficit or sensory deficit.  Mental Status: Alert, oriented, thought content appropriate, able to give a coherent  history. Speech fluent without evidence of aphasia. Able to follow 2 step commands without difficulty. Cranial Nerves: II: Peripheral visual fields grossly normal, pupils equal, round, reactive to light III,IV, VI: ptosis not present, extra-ocular motions intact bilaterally V,VII: smile symmetric, eyebrows raise symmetric, facial light touch sensation equal VIII: hearing grossly normal to voice X: uvula elevates symmetrically XI: bilateral shoulder shrug symmetric and strong XII: midline tongue extension without fassiculations Motor: Normal tone. 5/5 strength in upper and lower extremities bilaterally including strong and equal grip strength and dorsiflexion/plantar flexion Sensory: Sensation intact to light touch in all extremities. Cerebellar:  normal finger-to-nose with bilateral upper extremities. Normal heel-to -shin balance bilaterally of the lower extremity. Gait: normal gait and balance CV: distal pulses palpable throughout  Skin: Skin is warm, dry and intact. Capillary refill takes less than 2 seconds.  Psychiatric: He has a normal mood and affect. His behavior is normal.    ED Treatments / Results  Labs (all labs ordered are listed, but only abnormal results are displayed) Labs Reviewed - No data to display  EKG None  Radiology Dg Lumbar Spine Complete  Result Date: 11/22/2017 CLINICAL DATA:  3-5 days of back pain and right lower sided rib pain radiating to the buttocks. EXAM: LUMBAR SPINE - COMPLETE 4+ VIEW COMPARISON:  CT abdomen and pelvis 12/09/2012 FINDINGS: Grade 2 bordering on grade 3 spondylolisthesis of L5 on S1 with associated L5 spondylolysis, slightly worse in appearance than on prior CT. Associated moderate degenerative disc disease at L5-S1 is redemonstrated with lesser degrees of disc flattening at L1-2, L2-3 and L3-4. Facet arthrosis at L4-5 is identified with joint space narrowing and sclerosis. No acute osseous abnormality. The included SI joints and  sacrum appear intact with mild sclerosis of both SI joints. IMPRESSION: 1. Progression of spondylolisthesis of L5 on S1 now bordering on grade 3 since prior CT. 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 2. No acute lumbar spine fracture. Lumbar spondylosis with multilevel disc flattening as above facet arthrosis. 3. Sclerosis of the sacroiliac joints bilaterally the representing stigmata of sacroiliitis osteoarthritis. Electronically Signed   By: Ashley Royalty M.D.   On: 11/22/2017 17:27    Procedures Procedures (including critical care time)  Medications Ordered in ED Medications - No data to display   Initial Impression / Assessment and Plan / ED Course  I have reviewed the triage vital signs and the nursing notes.  Pertinent labs & imaging results that were available during my care of the patient were reviewed by me and considered in my medical decision making (see chart for details).  Clinical Course as of Nov 23 1704  Fri Nov 22, 2017  1800 Results of lumbar x-rays discussed with Dr. Laverta Baltimore.  Dr. Laverta Baltimore suggest symptomatic treatment for the patient and follow-up with neurosurgery outpatient.   [BM]  1829 Patient given first dose of medications here.  He states that his wife will drive home.   [BM]    Clinical Course User Index [BM] Deliah Boston, PA-C   Patient presenting with bilateral lower back pain.   No neurological deficits and normal neuro exam.  Patient can walk but states is painful.  Patient denies loss of bowel/bladder control or saddle area paresthesias.  No concern for cauda equina.  No fever, night sweats, weight loss, h/o cancer, or IVDU. RICE protocol and pain medicine indicated and discussed with patient.   Naproxen 500mg  BID prescribed. Patient denies history of CKD or gastric ulcers/bleeding. Robaxin 500mg  BID prescribed. Patient informed to avoid driving or operating heavy machinery while taking muscle relaxer.  Results of x-ray discussed with patient, informed  of need for neurosurgery follow-up as outpatient.  Patient is agreeable and states that he will call to schedule appointment as soon as he can.  At this time there does not appear to be any evidence of an acute emergency medical condition and the patient appears stable for discharge with appropriate outpatient follow up. Diagnosis was discussed with patient who verbalizes understanding of care plan and is agreeable to discharge. I have discussed return precautions with patient and family at bedside  who verbalize understanding of return precautions. Patient strongly encouraged to follow-up with their PCP. All questions answered.  Patient's case discussed with Dr. Laverta Baltimore who agrees with plan to discharge with follow-up.     Note: Portions of this report may have been transcribed using voice recognition software. Every effort was made to ensure accuracy; however, inadvertent computerized transcription errors may still be present.  Final Clinical Impressions(s) / ED Diagnoses   Final diagnoses:  Bilateral low back pain with right-sided sciatica, unspecified chronicity    ED Discharge Orders         Ordered    naproxen (NAPROSYN) 500 MG tablet  2 times daily     11/22/17 1811    methocarbamol (ROBAXIN) 500 MG tablet  2 times daily     11/22/17 1811           Gari Crown 11/22/17 1826    Deliah Boston, PA-C 11/23/17 1706    Margette Fast, MD 11/24/17 1110

## 2017-11-27 DIAGNOSIS — M9901 Segmental and somatic dysfunction of cervical region: Secondary | ICD-10-CM | POA: Diagnosis not present

## 2017-11-27 DIAGNOSIS — M9907 Segmental and somatic dysfunction of upper extremity: Secondary | ICD-10-CM | POA: Diagnosis not present

## 2017-11-27 DIAGNOSIS — M5031 Other cervical disc degeneration,  high cervical region: Secondary | ICD-10-CM | POA: Diagnosis not present

## 2017-11-27 DIAGNOSIS — M9902 Segmental and somatic dysfunction of thoracic region: Secondary | ICD-10-CM | POA: Diagnosis not present

## 2017-11-28 DIAGNOSIS — M9901 Segmental and somatic dysfunction of cervical region: Secondary | ICD-10-CM | POA: Diagnosis not present

## 2017-11-28 DIAGNOSIS — M9902 Segmental and somatic dysfunction of thoracic region: Secondary | ICD-10-CM | POA: Diagnosis not present

## 2017-11-28 DIAGNOSIS — M5031 Other cervical disc degeneration,  high cervical region: Secondary | ICD-10-CM | POA: Diagnosis not present

## 2017-11-28 DIAGNOSIS — M9907 Segmental and somatic dysfunction of upper extremity: Secondary | ICD-10-CM | POA: Diagnosis not present

## 2017-12-02 DIAGNOSIS — M5031 Other cervical disc degeneration,  high cervical region: Secondary | ICD-10-CM | POA: Diagnosis not present

## 2017-12-02 DIAGNOSIS — M9902 Segmental and somatic dysfunction of thoracic region: Secondary | ICD-10-CM | POA: Diagnosis not present

## 2017-12-02 DIAGNOSIS — M9901 Segmental and somatic dysfunction of cervical region: Secondary | ICD-10-CM | POA: Diagnosis not present

## 2017-12-02 DIAGNOSIS — M9907 Segmental and somatic dysfunction of upper extremity: Secondary | ICD-10-CM | POA: Diagnosis not present

## 2017-12-04 DIAGNOSIS — M9907 Segmental and somatic dysfunction of upper extremity: Secondary | ICD-10-CM | POA: Diagnosis not present

## 2017-12-04 DIAGNOSIS — M5031 Other cervical disc degeneration,  high cervical region: Secondary | ICD-10-CM | POA: Diagnosis not present

## 2017-12-04 DIAGNOSIS — M9902 Segmental and somatic dysfunction of thoracic region: Secondary | ICD-10-CM | POA: Diagnosis not present

## 2017-12-04 DIAGNOSIS — M9901 Segmental and somatic dysfunction of cervical region: Secondary | ICD-10-CM | POA: Diagnosis not present

## 2017-12-13 DIAGNOSIS — G4733 Obstructive sleep apnea (adult) (pediatric): Secondary | ICD-10-CM | POA: Diagnosis not present

## 2017-12-23 DIAGNOSIS — Z0000 Encounter for general adult medical examination without abnormal findings: Secondary | ICD-10-CM | POA: Diagnosis not present

## 2017-12-26 DIAGNOSIS — Z0000 Encounter for general adult medical examination without abnormal findings: Secondary | ICD-10-CM | POA: Diagnosis not present

## 2017-12-27 DIAGNOSIS — M545 Low back pain: Secondary | ICD-10-CM | POA: Diagnosis not present

## 2018-01-06 DIAGNOSIS — R42 Dizziness and giddiness: Secondary | ICD-10-CM | POA: Diagnosis not present

## 2018-01-06 DIAGNOSIS — R000 Tachycardia, unspecified: Secondary | ICD-10-CM | POA: Diagnosis not present

## 2018-02-12 DIAGNOSIS — E291 Testicular hypofunction: Secondary | ICD-10-CM | POA: Diagnosis not present

## 2018-02-12 DIAGNOSIS — Z125 Encounter for screening for malignant neoplasm of prostate: Secondary | ICD-10-CM | POA: Diagnosis not present

## 2018-03-07 DIAGNOSIS — E291 Testicular hypofunction: Secondary | ICD-10-CM | POA: Diagnosis not present

## 2018-03-07 DIAGNOSIS — N5201 Erectile dysfunction due to arterial insufficiency: Secondary | ICD-10-CM | POA: Diagnosis not present

## 2018-04-21 DIAGNOSIS — M9901 Segmental and somatic dysfunction of cervical region: Secondary | ICD-10-CM | POA: Diagnosis not present

## 2018-04-21 DIAGNOSIS — M546 Pain in thoracic spine: Secondary | ICD-10-CM | POA: Diagnosis not present

## 2018-04-21 DIAGNOSIS — M9907 Segmental and somatic dysfunction of upper extremity: Secondary | ICD-10-CM | POA: Diagnosis not present

## 2018-04-21 DIAGNOSIS — M9902 Segmental and somatic dysfunction of thoracic region: Secondary | ICD-10-CM | POA: Diagnosis not present

## 2018-05-19 DIAGNOSIS — M9901 Segmental and somatic dysfunction of cervical region: Secondary | ICD-10-CM | POA: Diagnosis not present

## 2018-05-19 DIAGNOSIS — M9907 Segmental and somatic dysfunction of upper extremity: Secondary | ICD-10-CM | POA: Diagnosis not present

## 2018-05-19 DIAGNOSIS — M546 Pain in thoracic spine: Secondary | ICD-10-CM | POA: Diagnosis not present

## 2018-05-19 DIAGNOSIS — M9902 Segmental and somatic dysfunction of thoracic region: Secondary | ICD-10-CM | POA: Diagnosis not present

## 2018-05-22 DIAGNOSIS — M25512 Pain in left shoulder: Secondary | ICD-10-CM | POA: Diagnosis not present

## 2018-06-05 DIAGNOSIS — R05 Cough: Secondary | ICD-10-CM | POA: Diagnosis not present

## 2018-07-08 DIAGNOSIS — M9902 Segmental and somatic dysfunction of thoracic region: Secondary | ICD-10-CM | POA: Diagnosis not present

## 2018-07-08 DIAGNOSIS — M9907 Segmental and somatic dysfunction of upper extremity: Secondary | ICD-10-CM | POA: Diagnosis not present

## 2018-07-08 DIAGNOSIS — M9901 Segmental and somatic dysfunction of cervical region: Secondary | ICD-10-CM | POA: Diagnosis not present

## 2018-07-08 DIAGNOSIS — M546 Pain in thoracic spine: Secondary | ICD-10-CM | POA: Diagnosis not present

## 2018-07-10 DIAGNOSIS — M9901 Segmental and somatic dysfunction of cervical region: Secondary | ICD-10-CM | POA: Diagnosis not present

## 2018-07-10 DIAGNOSIS — M546 Pain in thoracic spine: Secondary | ICD-10-CM | POA: Diagnosis not present

## 2018-07-10 DIAGNOSIS — M9907 Segmental and somatic dysfunction of upper extremity: Secondary | ICD-10-CM | POA: Diagnosis not present

## 2018-07-10 DIAGNOSIS — M9902 Segmental and somatic dysfunction of thoracic region: Secondary | ICD-10-CM | POA: Diagnosis not present

## 2018-07-14 MED FILL — TESTOSTERONE CYP 200 MG/ML: 200 | 28 days supply | Qty: 4 | Fill #0

## 2018-07-17 DIAGNOSIS — M9907 Segmental and somatic dysfunction of upper extremity: Secondary | ICD-10-CM | POA: Diagnosis not present

## 2018-07-17 DIAGNOSIS — M9902 Segmental and somatic dysfunction of thoracic region: Secondary | ICD-10-CM | POA: Diagnosis not present

## 2018-07-17 DIAGNOSIS — M9901 Segmental and somatic dysfunction of cervical region: Secondary | ICD-10-CM | POA: Diagnosis not present

## 2018-07-17 DIAGNOSIS — M546 Pain in thoracic spine: Secondary | ICD-10-CM | POA: Diagnosis not present

## 2018-07-22 DIAGNOSIS — M546 Pain in thoracic spine: Secondary | ICD-10-CM | POA: Diagnosis not present

## 2018-07-22 DIAGNOSIS — M9907 Segmental and somatic dysfunction of upper extremity: Secondary | ICD-10-CM | POA: Diagnosis not present

## 2018-07-22 DIAGNOSIS — M9902 Segmental and somatic dysfunction of thoracic region: Secondary | ICD-10-CM | POA: Diagnosis not present

## 2018-07-22 DIAGNOSIS — M9901 Segmental and somatic dysfunction of cervical region: Secondary | ICD-10-CM | POA: Diagnosis not present

## 2018-07-28 DIAGNOSIS — M9902 Segmental and somatic dysfunction of thoracic region: Secondary | ICD-10-CM | POA: Diagnosis not present

## 2018-07-28 DIAGNOSIS — M9901 Segmental and somatic dysfunction of cervical region: Secondary | ICD-10-CM | POA: Diagnosis not present

## 2018-07-28 DIAGNOSIS — M546 Pain in thoracic spine: Secondary | ICD-10-CM | POA: Diagnosis not present

## 2018-07-28 DIAGNOSIS — M9907 Segmental and somatic dysfunction of upper extremity: Secondary | ICD-10-CM | POA: Diagnosis not present

## 2018-08-06 DIAGNOSIS — M546 Pain in thoracic spine: Secondary | ICD-10-CM | POA: Diagnosis not present

## 2018-08-06 DIAGNOSIS — M9902 Segmental and somatic dysfunction of thoracic region: Secondary | ICD-10-CM | POA: Diagnosis not present

## 2018-08-06 DIAGNOSIS — M9907 Segmental and somatic dysfunction of upper extremity: Secondary | ICD-10-CM | POA: Diagnosis not present

## 2018-08-06 DIAGNOSIS — M9901 Segmental and somatic dysfunction of cervical region: Secondary | ICD-10-CM | POA: Diagnosis not present

## 2018-09-15 ENCOUNTER — Other Ambulatory Visit: Payer: Self-pay | Admitting: Preventative Medicine

## 2018-09-15 DIAGNOSIS — M25511 Pain in right shoulder: Secondary | ICD-10-CM

## 2018-09-17 ENCOUNTER — Ambulatory Visit
Admission: RE | Admit: 2018-09-17 | Discharge: 2018-09-17 | Disposition: A | Discharge status: Home / Self Care | Payer: Worker's Compensation | Source: Ambulatory Visit | Attending: Diagnostic Radiology | Admitting: Diagnostic Radiology

## 2018-09-17 DIAGNOSIS — M25511 Pain in right shoulder: Secondary | ICD-10-CM

## 2018-09-26 ENCOUNTER — Other Ambulatory Visit: Payer: Managed Care, Other (non HMO)

## 2018-09-30 ENCOUNTER — Other Ambulatory Visit: Payer: Self-pay | Admitting: Preventative Medicine

## 2019-01-29 DIAGNOSIS — Z0000 Encounter for general adult medical examination without abnormal findings: Secondary | ICD-10-CM | POA: Diagnosis not present

## 2019-03-05 DIAGNOSIS — Z13220 Encounter for screening for lipoid disorders: Secondary | ICD-10-CM | POA: Diagnosis not present

## 2019-03-05 DIAGNOSIS — Z1159 Encounter for screening for other viral diseases: Secondary | ICD-10-CM | POA: Diagnosis not present

## 2019-03-05 DIAGNOSIS — E559 Vitamin D deficiency, unspecified: Secondary | ICD-10-CM | POA: Diagnosis not present

## 2019-03-05 DIAGNOSIS — Z0000 Encounter for general adult medical examination without abnormal findings: Secondary | ICD-10-CM | POA: Diagnosis not present

## 2019-03-09 DIAGNOSIS — Z125 Encounter for screening for malignant neoplasm of prostate: Secondary | ICD-10-CM | POA: Diagnosis not present

## 2019-03-09 DIAGNOSIS — E291 Testicular hypofunction: Secondary | ICD-10-CM | POA: Diagnosis not present

## 2019-03-12 DIAGNOSIS — R899 Unspecified abnormal finding in specimens from other organs, systems and tissues: Secondary | ICD-10-CM | POA: Diagnosis not present

## 2019-03-16 DIAGNOSIS — E291 Testicular hypofunction: Secondary | ICD-10-CM | POA: Diagnosis not present

## 2019-03-16 DIAGNOSIS — N5201 Erectile dysfunction due to arterial insufficiency: Secondary | ICD-10-CM | POA: Diagnosis not present

## 2019-05-04 DIAGNOSIS — K648 Other hemorrhoids: Secondary | ICD-10-CM | POA: Diagnosis not present

## 2019-05-04 DIAGNOSIS — Z800 Family history of malignant neoplasm of digestive organs: Secondary | ICD-10-CM | POA: Diagnosis not present

## 2019-05-21 IMAGING — DX DG LUMBAR SPINE COMPLETE 4+V
5 series · 5 of 5 positions shown · non-contrast
Comparison: CT abdomen and pelvis 12/09/2012

CLINICAL DATA: 3-5 days of back pain and right lower sided rib pain
radiating to the buttocks.

EXAM:
LUMBAR SPINE - COMPLETE 4+ VIEW

[t lumbar spine ap]
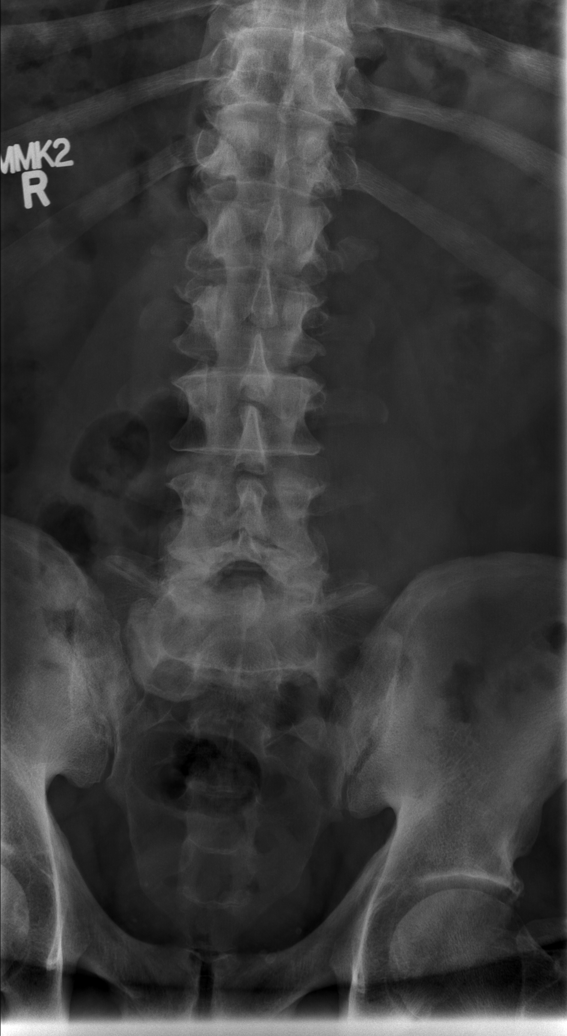

[t lumbar spine obl (1 of 2)]
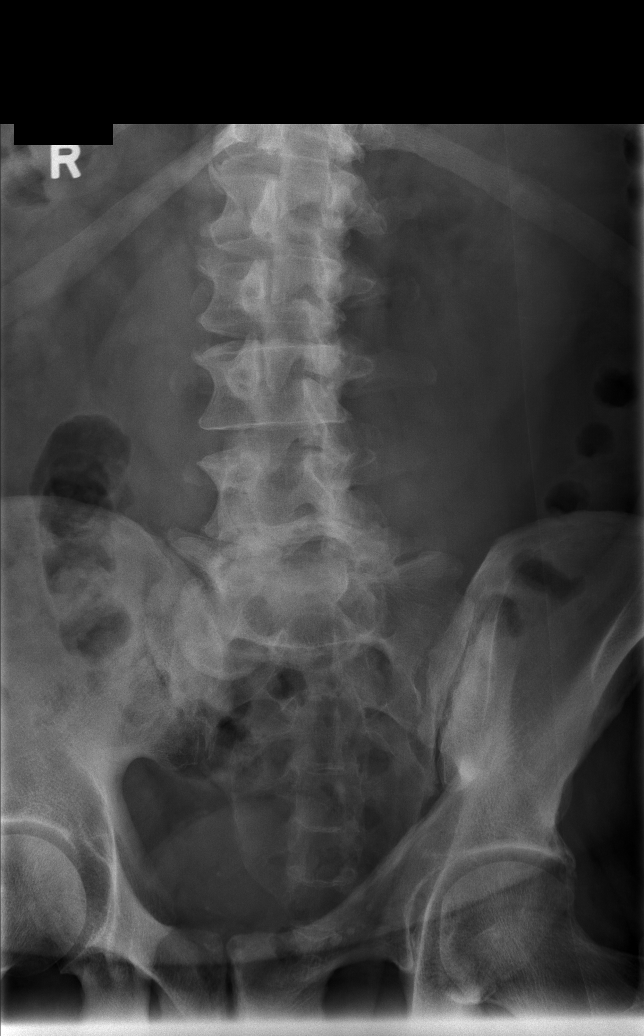

[t lumbar spine obl (2 of 2)]
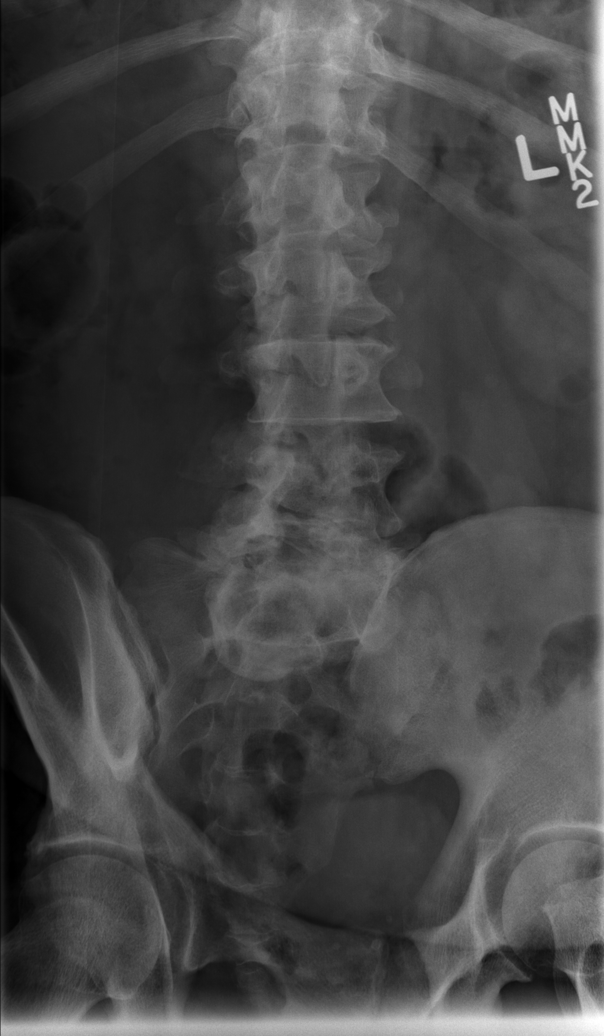

[t lumbar spine lat]
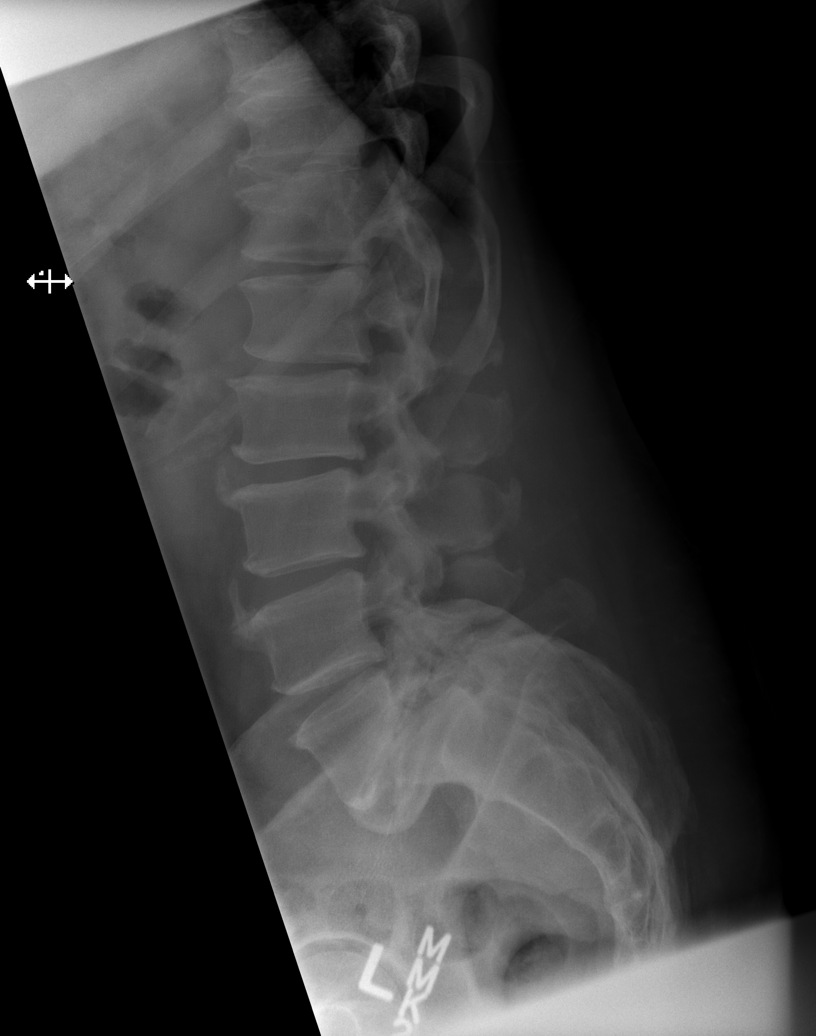

[t lumbar l-5 s-1 spot]
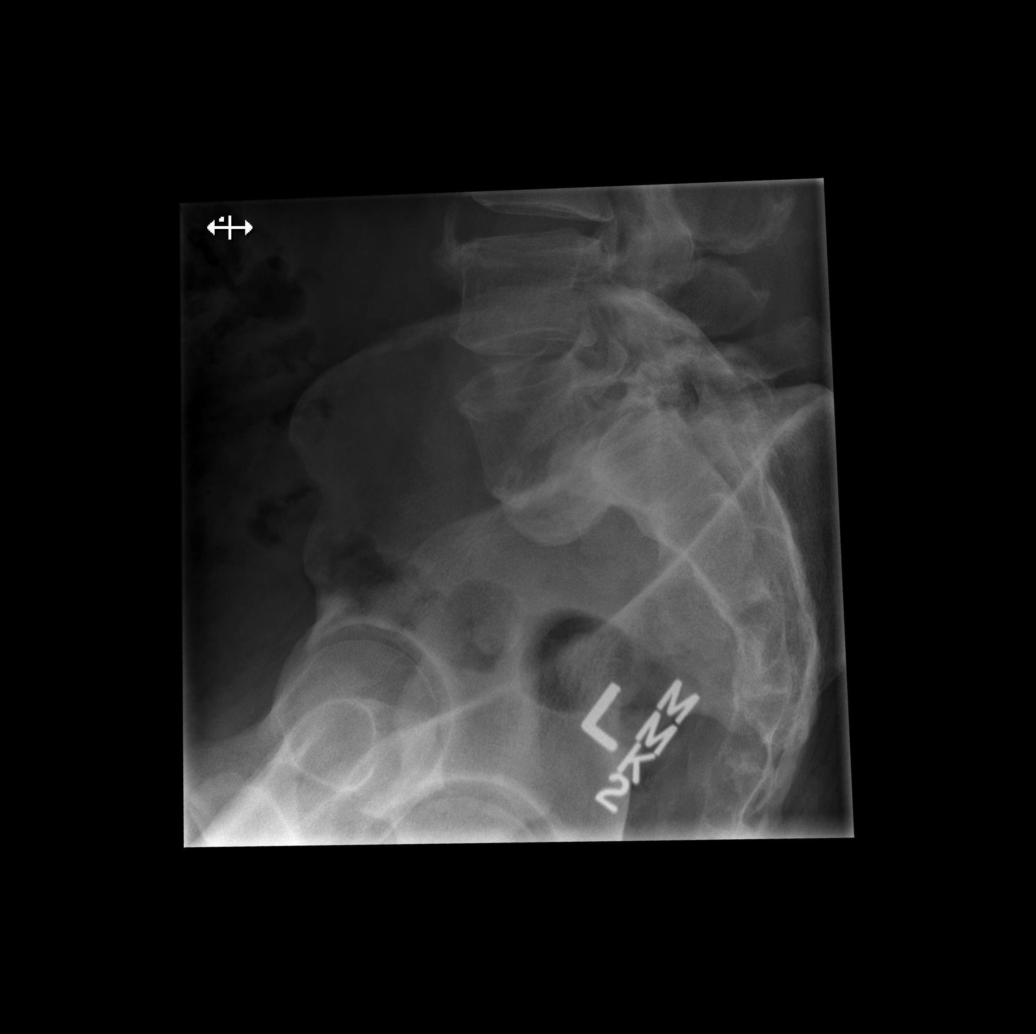

[5 of 5 positions shown; findings below may reference images not displayed]

FINDINGS: Grade 2 bordering on grade 3 spondylolisthesis of L5 on S1 with
associated L5 spondylolysis, slightly worse in appearance than on
prior CT. Associated moderate degenerative disc disease at L5-S1 is
redemonstrated with lesser degrees of disc flattening at L1-2, L2-3
and L3-4. Facet arthrosis at L4-5 is identified with joint space
narrowing and sclerosis. No acute osseous abnormality. The included
SI joints and sacrum appear intact with mild sclerosis of both SI
joints.
IMPRESSION: 1. Progression of spondylolisthesis of L5 on S1 now bordering on
grade 3 since prior CT. Pars articularis defects at L5 as before.
2. No acute lumbar spine fracture. Lumbar spondylosis with
multilevel disc flattening as above facet arthrosis.
3. Sclerosis of the sacroiliac joints bilaterally the representing
stigmata of sacroiliitis osteoarthritis.

## 2019-06-29 DIAGNOSIS — M549 Dorsalgia, unspecified: Secondary | ICD-10-CM | POA: Diagnosis not present

## 2019-11-12 DIAGNOSIS — H00015 Hordeolum externum left lower eyelid: Secondary | ICD-10-CM | POA: Diagnosis not present

## 2020-02-03 DIAGNOSIS — K649 Unspecified hemorrhoids: Secondary | ICD-10-CM | POA: Diagnosis not present

## 2020-02-03 DIAGNOSIS — Z0000 Encounter for general adult medical examination without abnormal findings: Secondary | ICD-10-CM | POA: Diagnosis not present

## 2020-02-03 DIAGNOSIS — I10 Essential (primary) hypertension: Secondary | ICD-10-CM | POA: Diagnosis not present

## 2020-02-03 DIAGNOSIS — Z13220 Encounter for screening for lipoid disorders: Secondary | ICD-10-CM | POA: Diagnosis not present

## 2020-03-02 DIAGNOSIS — J40 Bronchitis, not specified as acute or chronic: Secondary | ICD-10-CM | POA: Diagnosis not present

## 2020-03-02 DIAGNOSIS — J329 Chronic sinusitis, unspecified: Secondary | ICD-10-CM | POA: Diagnosis not present

## 2020-04-06 DIAGNOSIS — R5383 Other fatigue: Secondary | ICD-10-CM | POA: Diagnosis not present

## 2020-04-06 DIAGNOSIS — R059 Cough, unspecified: Secondary | ICD-10-CM | POA: Diagnosis not present

## 2020-04-06 DIAGNOSIS — R52 Pain, unspecified: Secondary | ICD-10-CM | POA: Diagnosis not present

## 2020-04-06 DIAGNOSIS — B349 Viral infection, unspecified: Secondary | ICD-10-CM | POA: Diagnosis not present

## 2020-04-18 DIAGNOSIS — N5201 Erectile dysfunction due to arterial insufficiency: Secondary | ICD-10-CM | POA: Diagnosis not present

## 2020-04-18 DIAGNOSIS — Z125 Encounter for screening for malignant neoplasm of prostate: Secondary | ICD-10-CM | POA: Diagnosis not present

## 2020-04-18 DIAGNOSIS — E291 Testicular hypofunction: Secondary | ICD-10-CM | POA: Diagnosis not present

## 2020-05-03 DIAGNOSIS — N5201 Erectile dysfunction due to arterial insufficiency: Secondary | ICD-10-CM | POA: Diagnosis not present

## 2020-05-30 ENCOUNTER — Emergency Department (HOSPITAL_COMMUNITY)
Admission: EM | Admit: 2020-05-30 | Discharge: 2020-05-30 | Disposition: A | Discharge status: Home / Self Care | Payer: BC Managed Care – PPO | Attending: Emergency Medicine | Admitting: Emergency Medicine

## 2020-05-30 ENCOUNTER — Encounter (HOSPITAL_COMMUNITY): Payer: Self-pay

## 2020-05-30 ENCOUNTER — Emergency Department (HOSPITAL_COMMUNITY): Payer: BC Managed Care – PPO

## 2020-05-30 DIAGNOSIS — K5792 Diverticulitis of intestine, part unspecified, without perforation or abscess without bleeding: Secondary | ICD-10-CM | POA: Diagnosis not present

## 2020-05-30 DIAGNOSIS — R1031 Right lower quadrant pain: Secondary | ICD-10-CM | POA: Diagnosis not present

## 2020-05-30 DIAGNOSIS — Z79899 Other long term (current) drug therapy: Secondary | ICD-10-CM | POA: Diagnosis not present

## 2020-05-30 DIAGNOSIS — Z20822 Contact with and (suspected) exposure to covid-19: Secondary | ICD-10-CM | POA: Insufficient documentation

## 2020-05-30 DIAGNOSIS — Z87891 Personal history of nicotine dependence: Secondary | ICD-10-CM | POA: Insufficient documentation

## 2020-05-30 DIAGNOSIS — R1030 Lower abdominal pain, unspecified: Secondary | ICD-10-CM

## 2020-05-30 DIAGNOSIS — I10 Essential (primary) hypertension: Secondary | ICD-10-CM | POA: Insufficient documentation

## 2020-05-30 LAB — RESP PANEL BY RT-PCR (FLU A&B, COVID) ARPGX2
Influenza A by PCR: NEGATIVE
Influenza B by PCR: NEGATIVE
SARS Coronavirus 2 by RT PCR: NEGATIVE

## 2020-05-30 LAB — COMPREHENSIVE METABOLIC PANEL
ALT: 22 U/L (ref 0–44)
AST: 17 U/L (ref 15–41)
Albumin: 4.4 g/dL (ref 3.5–5.0)
Alkaline Phosphatase: 44 U/L (ref 38–126)
Anion gap: 8 (ref 5–15)
BUN: 14 mg/dL (ref 6–20)
CO2: 26 mmol/L (ref 22–32)
Calcium: 8.7 mg/dL — ABNORMAL LOW (ref 8.9–10.3)
Chloride: 104 mmol/L (ref 98–111)
Creatinine, Ser: 1.05 mg/dL (ref 0.61–1.24)
GFR, Estimated: >60 mL/min (ref >60)
Glucose, Bld: 94 mg/dL (ref 70–99)
Potassium: 4 mmol/L (ref 3.5–5.1)
Sodium: 138 mmol/L (ref 135–145)
Total Bilirubin: 1.1 mg/dL (ref 0.3–1.2)
Total Protein: 7.3 g/dL (ref 6.5–8.1)

## 2020-05-30 LAB — URINALYSIS, ROUTINE W REFLEX MICROSCOPIC
Bilirubin Urine: NEGATIVE
Glucose, UA: NEGATIVE mg/dL
Hgb urine dipstick: NEGATIVE
Ketones, ur: NEGATIVE mg/dL
Leukocytes,Ua: NEGATIVE
Nitrite: NEGATIVE
Protein, ur: NEGATIVE mg/dL
Specific Gravity, Urine: 1.009 (ref 1.005–1.030)
pH: 6 (ref 5.0–8.0)

## 2020-05-30 LAB — CBC
HCT: 47.3 % (ref 39.0–52.0)
Hemoglobin: 15.1 g/dL (ref 13.0–17.0)
MCH: 29.7 pg (ref 26.0–34.0)
MCHC: 31.9 g/dL (ref 30.0–36.0)
MCV: 93.1 fL (ref 80.0–100.0)
Platelets: 244 10*3/uL (ref 150–400)
RBC: 5.08 MIL/uL (ref 4.22–5.81)
RDW: 15.3 % (ref 11.5–15.5)
WBC: 11.3 10*3/uL — ABNORMAL HIGH (ref 4.0–10.5)
nRBC: 0 % (ref 0.0–0.2)

## 2020-05-30 LAB — LACTIC ACID, PLASMA: Lactic Acid, Venous: 1 mmol/L (ref 0.5–1.9)

## 2020-05-30 LAB — LIPASE, BLOOD: Lipase: 30 U/L (ref 11–51)

## 2020-05-30 MED ORDER — MORPHINE SULFATE (PF) 4 MG/ML IV SOLN
4.0000 mg | Freq: Once | INTRAVENOUS | Status: AC
  Administered 2020-05-30: 4 mg via INTRAVENOUS
  Filled 2020-05-30: qty 1

## 2020-05-30 MED ORDER — OXYCODONE-ACETAMINOPHEN 5-325 MG PO TABS: 1.0000 | ORAL_TABLET | ORAL | 0 refills | Status: AC | PRN

## 2020-05-30 MED ORDER — ONDANSETRON HCL 4 MG PO TABS: 4.0000 mg | ORAL_TABLET | Freq: Three times a day (TID) | ORAL | 0 refills | Status: DC | PRN

## 2020-05-30 MED ORDER — ONDANSETRON HCL 4 MG PO TABS: 4.0000 mg | ORAL_TABLET | Freq: Three times a day (TID) | ORAL | 0 refills | Status: AC | PRN

## 2020-05-30 MED ORDER — METRONIDAZOLE 500 MG PO TABS: 500.0000 mg | ORAL_TABLET | Freq: Three times a day (TID) | ORAL | 0 refills | Status: DC

## 2020-05-30 MED ORDER — IOHEXOL 300 MG/ML  SOLN
100.0000 mL | Freq: Once | INTRAMUSCULAR | Status: AC | PRN
  Administered 2020-05-30: 100 mL via INTRAVENOUS

## 2020-05-30 MED ORDER — METRONIDAZOLE 500 MG PO TABS: 500.0000 mg | ORAL_TABLET | Freq: Three times a day (TID) | ORAL | 0 refills | Status: AC

## 2020-05-30 MED ORDER — SULFAMETHOXAZOLE-TRIMETHOPRIM 800-160 MG PO TABS: 1.0000 | ORAL_TABLET | Freq: Two times a day (BID) | ORAL | 0 refills | Status: DC

## 2020-05-30 MED ORDER — OXYCODONE-ACETAMINOPHEN 5-325 MG PO TABS: 1.0000 | ORAL_TABLET | ORAL | 0 refills | Status: DC | PRN

## 2020-05-30 MED ORDER — SULFAMETHOXAZOLE-TRIMETHOPRIM 800-160 MG PO TABS: 1.0000 | ORAL_TABLET | Freq: Two times a day (BID) | ORAL | 0 refills | Status: AC

## 2020-05-30 NOTE — ED Provider Notes (Signed)
[facility] DEPT Provider Note   CSN: [id number] Arrival date & time: 05/30/20  5170     History Chief Complaint  Patient presents with  . Abdominal Pain    Joshua Myers is a 60 y.o. male.  The history is provided by the patient and medical records. No language interpreter was used.  Abdominal Pain Pain location:  RLQ and suprapubic Pain quality: aching, heavy and pressure   Pain radiates to:  Does not radiate Pain severity:  Severe Onset quality:  Sudden Duration:  10 hours Timing:  Constant Progression:  Waxing and waning Chronicity:  New Context: awakening from sleep and previous surgery (hernia)   Context: not recent illness and not trauma   Relieved by:  Nothing Worsened by:  Palpation Ineffective treatments:  None tried Associated symptoms: no chest pain, no chills, no constipation, no cough, no diarrhea, no dysuria, no fatigue, no fever, no nausea, no shortness of breath and no vomiting        Past Medical History:  Diagnosis Date  . History of hernia repair   . History of umbilical hernia repair   . Hypertension   . Sleep apnea     Patient Active Problem List   Diagnosis Date Noted  . Complete tear of right rotator cuff 08/21/2016  . Umbilical pain 01/74/9449    Past Surgical History:  Procedure Laterality Date  . CARPAL TUNNEL RELEASE    . HERNIA REPAIR     umb hernia  . SHOULDER OPEN ROTATOR CUFF REPAIR Left 08/21/2016   Procedure: ROTATOR CUFF REPAIR SHOULDER OPEN with graft;  Surgeon: Latanya Maudlin, MD;  Location: [facility] ORS;  Service: Orthopedics;  Laterality: Left;  Need RNFA  . SHOULDER SURGERY         Family History  Problem Relation Age of Onset  . Cancer Mother        lung  . Cancer Father        colon    Social History   Tobacco Use  . Smoking status: Never Smoker  . Smokeless tobacco: Former Network engineer Use Topics  . Alcohol use: No  . Drug use: No    Home Medications Prior to  Admission medications   Medication Sig Start Date End Date Taking? Authorizing Provider  Cholecalciferol (VITAMIN D) 2000 units tablet Take 2,000 Units by mouth daily.    [provider]  fish oil-omega-3 fatty acids 1000 MG capsule Take 2 g by mouth daily.    [provider]  fluticasone (FLONASE) 50 MCG/ACT nasal spray Place 2 sprays into both nostrils daily.    [provider]  HYDROcodone-acetaminophen (NORCO/VICODIN) 5-325 MG tablet Take 1-2 tablets by mouth every 4 (four) hours as needed (breakthrough pain). 08/22/16   Danae Orleans, PA-C  lisinopril (PRINIVIL,ZESTRIL) 40 MG tablet Take 20 mg by mouth daily.    [provider]  methocarbamol (ROBAXIN) 500 MG tablet Take 1 tablet (500 mg total) by mouth 2 (two) times daily. 11/22/17   Nuala Alpha A, PA-C  Misc Natural Products (HM JOINT HEALTH ULTRA PO) Take 30 mLs by mouth daily.    [provider]  naproxen (NAPROSYN) 500 MG tablet Take 1 tablet (500 mg total) by mouth 2 (two) times daily. 11/22/17   Nuala Alpha A, PA-C  polyethylene glycol (MIRALAX / GLYCOLAX) packet Take 17 g by mouth daily as needed for mild constipation. 08/22/16   Danae Orleans, PA-C  Potassium Gluconate 550 MG TABS Take 2 tablets  by mouth daily.    [provider]  SUPER B COMPLEX/C PO Take 1 tablet by mouth daily.    [provider]  TESTOSTERONE IM Inject 0.5 mLs into the muscle once a week.     [provider]  vitamin C (ASCORBIC ACID) 500 MG tablet Take 500 mg by mouth daily.    [provider]    Allergies    Cephalexin and Other  Review of Systems   Review of Systems  Constitutional: Negative for chills, diaphoresis, fatigue and fever.  HENT: Negative for congestion.   Respiratory: Negative for cough, chest tightness, shortness of breath and wheezing.   Cardiovascular: Negative for chest pain, palpitations and leg swelling.  Gastrointestinal: Positive for abdominal  pain. Negative for constipation, diarrhea, nausea and vomiting.  Genitourinary: Negative for decreased urine volume, dysuria, flank pain, frequency, penile discharge, penile pain, penile swelling, scrotal swelling and testicular pain.  Musculoskeletal: Negative for back pain, neck pain and neck stiffness.  Neurological: Negative for light-headedness and headaches.  Psychiatric/Behavioral: Negative for agitation.  All other systems reviewed and are negative.   Physical Exam Updated Vital Signs BP (!) 145/87 (BP Location: Left Arm)   Pulse 85   Temp 99.4 F (37.4 C)   Resp 18   SpO2 94%   Physical Exam Vitals and nursing note reviewed.  Constitutional:      General: He is not in acute distress.    Appearance: He is well-developed and well-nourished. He is not ill-appearing, toxic-appearing or diaphoretic.  HENT:     Head: Normocephalic and atraumatic.  Eyes:     Extraocular Movements: Extraocular movements intact.     Conjunctiva/sclera: Conjunctivae normal.  Cardiovascular:     Rate and Rhythm: Normal rate and regular rhythm.     Heart sounds: Normal heart sounds. No murmur heard.   Pulmonary:     Effort: Pulmonary effort is normal. No respiratory distress.     Breath sounds: Normal breath sounds. No wheezing or rhonchi.  Chest:     Chest wall: No tenderness.  Abdominal:     General: Abdomen is flat. Bowel sounds are decreased. There is no distension.     Palpations: Abdomen is soft.     Tenderness: There is abdominal tenderness in the right lower quadrant and suprapubic area. There is no right CVA tenderness, left CVA tenderness, guarding or rebound.  Genitourinary:    Comments: Deferred Musculoskeletal:        General: No edema.     Cervical back: Neck supple.  Skin:    General: Skin is warm and dry.     Capillary Refill: Capillary refill takes less than 2 seconds.     Coloration: Skin is not pale.     Findings: No rash.  Neurological:     General: No focal  deficit present.     Mental Status: He is alert.  Psychiatric:        Mood and Affect: Mood and affect and mood normal. Mood is not anxious.     ED Results / Procedures / Treatments   Labs (all labs ordered are listed, but only abnormal results are displayed) Labs Reviewed  COMPREHENSIVE METABOLIC PANEL - Abnormal; Notable for the following components:      Result Value   Calcium 8.7 (*)    All other components within normal limits  CBC - Abnormal; Notable for the following components:   WBC 11.3 (*)    All other components within normal limits  RESP  PANEL BY RT-PCR (FLU A&B, COVID) ARPGX2  URINE CULTURE  LIPASE, BLOOD  URINALYSIS, ROUTINE W REFLEX MICROSCOPIC  LACTIC ACID, PLASMA  LACTIC ACID, PLASMA    EKG None  Radiology CT ABDOMEN PELVIS W CONTRAST  Result Date: 05/30/2020 CLINICAL DATA:  Right lower quadrant pain. EXAM: CT ABDOMEN AND PELVIS WITH CONTRAST TECHNIQUE: Multidetector CT imaging of the abdomen and pelvis was performed using the standard protocol following bolus administration of intravenous contrast. CONTRAST:  141mL OMNIPAQUE IOHEXOL 300 MG/ML  SOLN COMPARISON:  None. FINDINGS: Lower chest: Mild areas of scarring and/or atelectasis are seen within the bilateral lung bases. Hepatobiliary: There is diffuse fatty infiltration of the liver parenchyma. No focal liver abnormality is seen. No gallstones, gallbladder wall thickening, or biliary dilatation. Pancreas: Unremarkable. No pancreatic ductal dilatation or surrounding inflammatory changes. Spleen: Normal in size without focal abnormality. Adrenals/Urinary Tract: Adrenal glands are unremarkable. Kidneys are normal, without renal calculi, focal lesion, or hydronephrosis. Bladder is unremarkable. Stomach/Bowel: Stomach is within normal limits. Appendix appears normal. No evidence of bowel dilatation. Markedly inflamed diverticula are seen within the mid sigmoid colon. There is no evidence of associated perforation or  abscess. Vascular/Lymphatic: No significant vascular findings are present. No enlarged abdominal or pelvic lymph nodes. Reproductive: Prostate is unremarkable. Other: No abdominal wall hernia or abnormality. No abdominopelvic ascites. Musculoskeletal: There is grade 1 anterolisthesis of the L5 vertebral body on S1. IMPRESSION: 1. Acute sigmoid diverticulitis without evidence of associated perforation or abscess. 2. Fatty liver. 3. Grade 1 anterolisthesis of the L5 vertebral body on S1. Electronically Signed   By: Virgina Norfolk M.D.   On: 05/30/2020 11:35    Procedures Procedures    Medications Ordered in ED Medications  morphine 4 MG/ML injection 4 mg (4 mg Intravenous Given 05/30/20 1034)  iohexol (OMNIPAQUE) 300 MG/ML solution 100 mL (100 mLs Intravenous Contrast Given 05/30/20 1116)    ED Course  I have reviewed the triage vital signs and the nursing notes.  Pertinent labs & imaging results that were available during my care of the patient were reviewed by me and considered in my medical decision making (see chart for details).    MDM Rules/Calculators/A&P                          Joshua Myers is a 60 y.o. male with a past medical history significant for hypertension and prior medical hernia status post repair who presents with right lower quadrant lower abdominal pain.  Patient reports that last night, he woke up at midnight with severe pain in his right lower quadrant and lower abdomen.  He has never had this type of pain before.  He reports he had was gas pain but after passing gas and having bowel movement, the pain did not improve significantly.  He reports that urination did seem to make the pain improve and it did not burn when he peed.  He denies fevers, chills, chest pain or shortness of breath, nausea, or vomiting.  He denies any trauma.  Denies any skin changes.  Denies any history of this.  Denies any groin pain or testicle pain.  He denies any bumps or lumps in his abdomen  similar to prior hernias.  Describes the pain currently is a 3 out of 10 but at times it shoots up to a 6 out of 10.  On exam, bowel sounds did seem slightly diminished.  Her right lower quadrant was the most tender on  the abdomen.  He does not have guarding or rebound tenderness however.  The suprapubic area was also slightly tender.  No skin changes or rash seen.  He refused GU exam.  No flank or back tenderness.  No murmurs appreciated.  Lungs clear and chest and upper back nontender.  Extremity exam is unremarkable.  Given the patient's right lower quadrant tenderness, primarily concerned for appendicitis versus a right-sided diverticulitis versus constipation or other.  We will get screening labs and a CT scan.  We will have the right n.p.o.  Last time he ate was around 7:30 AM.  We will get a Covid test in case he needs go the operating room.  Patient wanted to wait on pain medicine as his pain was not severe initially.  Anticipate reassessment after work-up.        Work-up reveals acute diverticulitis as the cause of his discomfort.  There is no perforation or abscess.  I discussed with pharmacy given the patient's allergies and intolerances to other antibiotics.  Pharmacy recommends Bactrim and Flagyl for 10 days.   Final Clinical Impression(s) / ED Diagnoses Final diagnoses:  Diverticulitis  Lower abdominal pain    Rx / DC Orders ED Discharge Orders         Ordered    sulfamethoxazole-trimethoprim (BACTRIM DS) 800-160 MG tablet  2 times daily        05/30/20 1252    metroNIDAZOLE (FLAGYL) 500 MG tablet  3 times daily        05/30/20 1252    oxyCODONE-acetaminophen (PERCOCET/ROXICET) 5-325 MG tablet  Every 4 hours PRN        05/30/20 1252    ondansetron (ZOFRAN) 4 MG tablet  Every 8 hours PRN        05/30/20 1252          Clinical Impression: 1. Diverticulitis   2. Lower abdominal pain     Disposition: Discharge  Condition: Good  I have discussed the results, Dx  and Tx plan with the pt(& family if present). He/she/they expressed understanding and agree(s) with the plan. Discharge instructions discussed at great length. Strict return precautions discussed and pt &/or family have verbalized understanding of the instructions. No further questions at time of discharge.    New Prescriptions   METRONIDAZOLE (FLAGYL) 500 MG TABLET    Take 1 tablet (500 mg total) by mouth 3 (three) times daily for 10 days.   ONDANSETRON (ZOFRAN) 4 MG TABLET    Take 1 tablet (4 mg total) by mouth every 8 (eight) hours as needed for nausea or vomiting.   OXYCODONE-ACETAMINOPHEN (PERCOCET/ROXICET) 5-325 MG TABLET    Take 1 tablet by mouth every 4 (four) hours as needed for severe pain.   SULFAMETHOXAZOLE-TRIMETHOPRIM (BACTRIM DS) 800-160 MG TABLET    Take 1 tablet by mouth 2 (two) times daily for 10 days.    Follow Up: Lawerance Cruel, MD [place] [id number] [place] DEPT [place] [phone] mc [place]       Yishai Rehfeld, Gwenyth Allegra, MD 05/30/20 1253

## 2020-05-30 NOTE — ED Triage Notes (Signed)
Pt presents with c/o abdominal pain that started last night. Pt denies any N/V/D.

## 2020-05-30 NOTE — Discharge Instructions (Signed)
Your history, exam, work-up today revealed diverticulitis as the cause of your discomfort.  Your appendix did not look abnormal and your other work-up was reassuring.  Please try to continue staying hydrated and use the nausea and pain medicine help with discomfort.  Please take the antibiotics for the next 10 days to treat.  If any symptoms change or worsen acutely, please return to the nearest emergency department.  Please follow-up with your primary doctor  and your gastroenterologist as well.

## 2020-05-30 NOTE — ED Notes (Signed)
Recommended pt follow up with temperature checks at home and return to ED if fever does not resolve with antibiotics.

## 2020-05-30 NOTE — ED Notes (Signed)
Per Tegeler, Md no repeat lactic needed

## 2020-05-31 LAB — URINE CULTURE: Culture: NO GROWTH

## 2020-06-22 ENCOUNTER — Other Ambulatory Visit (HOSPITAL_COMMUNITY): Payer: Self-pay | Admitting: Urology

## 2020-07-13 DIAGNOSIS — H2513 Age-related nuclear cataract, bilateral: Secondary | ICD-10-CM | POA: Diagnosis not present

## 2020-07-13 DIAGNOSIS — H50011 Monocular esotropia, right eye: Secondary | ICD-10-CM | POA: Diagnosis not present

## 2020-07-13 DIAGNOSIS — H532 Diplopia: Secondary | ICD-10-CM | POA: Diagnosis not present

## 2020-07-13 DIAGNOSIS — H53031 Strabismic amblyopia, right eye: Secondary | ICD-10-CM | POA: Diagnosis not present

## 2020-07-13 DIAGNOSIS — H35033 Hypertensive retinopathy, bilateral: Secondary | ICD-10-CM | POA: Diagnosis not present

## 2020-08-08 ENCOUNTER — Other Ambulatory Visit (HOSPITAL_COMMUNITY): Payer: Self-pay

## 2021-01-05 ENCOUNTER — Other Ambulatory Visit (HOSPITAL_COMMUNITY): Payer: Self-pay

## 2021-01-09 ENCOUNTER — Other Ambulatory Visit (HOSPITAL_COMMUNITY): Payer: Self-pay

## 2021-02-03 DIAGNOSIS — E559 Vitamin D deficiency, unspecified: Secondary | ICD-10-CM | POA: Diagnosis not present

## 2021-02-03 DIAGNOSIS — Z13220 Encounter for screening for lipoid disorders: Secondary | ICD-10-CM | POA: Diagnosis not present

## 2021-02-03 DIAGNOSIS — Z0000 Encounter for general adult medical examination without abnormal findings: Secondary | ICD-10-CM | POA: Diagnosis not present

## 2021-02-03 DIAGNOSIS — Z125 Encounter for screening for malignant neoplasm of prostate: Secondary | ICD-10-CM | POA: Diagnosis not present

## 2021-02-03 DIAGNOSIS — I10 Essential (primary) hypertension: Secondary | ICD-10-CM | POA: Diagnosis not present

## 2021-02-15 DIAGNOSIS — K5792 Diverticulitis of intestine, part unspecified, without perforation or abscess without bleeding: Secondary | ICD-10-CM | POA: Diagnosis not present

## 2021-08-04 DIAGNOSIS — N5201 Erectile dysfunction due to arterial insufficiency: Secondary | ICD-10-CM | POA: Diagnosis not present

## 2021-08-04 DIAGNOSIS — E291 Testicular hypofunction: Secondary | ICD-10-CM | POA: Diagnosis not present

## 2021-10-26 DIAGNOSIS — E291 Testicular hypofunction: Secondary | ICD-10-CM | POA: Diagnosis not present

## 2021-10-26 DIAGNOSIS — Z125 Encounter for screening for malignant neoplasm of prostate: Secondary | ICD-10-CM | POA: Diagnosis not present

## 2021-11-03 ENCOUNTER — Emergency Department (HOSPITAL_COMMUNITY)
Admission: EM | Admit: 2021-11-03 | Discharge: 2021-11-03 | Disposition: A | Discharge status: Home / Self Care | Payer: BC Managed Care – PPO | Attending: Emergency Medicine | Admitting: Emergency Medicine

## 2021-11-03 ENCOUNTER — Emergency Department (HOSPITAL_COMMUNITY): Payer: BC Managed Care – PPO

## 2021-11-03 ENCOUNTER — Other Ambulatory Visit: Payer: Self-pay

## 2021-11-03 ENCOUNTER — Encounter (HOSPITAL_COMMUNITY): Payer: Self-pay

## 2021-11-03 DIAGNOSIS — Z79899 Other long term (current) drug therapy: Secondary | ICD-10-CM | POA: Insufficient documentation

## 2021-11-03 DIAGNOSIS — R079 Chest pain, unspecified: Secondary | ICD-10-CM | POA: Diagnosis not present

## 2021-11-03 DIAGNOSIS — Z041 Encounter for examination and observation following transport accident: Secondary | ICD-10-CM | POA: Diagnosis not present

## 2021-11-03 DIAGNOSIS — Y92410 Unspecified street and highway as the place of occurrence of the external cause: Secondary | ICD-10-CM | POA: Insufficient documentation

## 2021-11-03 MED ORDER — IBUPROFEN 200 MG PO TABS
600.0000 mg | ORAL_TABLET | Freq: Once | ORAL | Status: AC
  Administered 2021-11-03: 600 mg via ORAL
  Filled 2021-11-03: qty 3
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
## 2022-08-13 ENCOUNTER — Other Ambulatory Visit (HOSPITAL_COMMUNITY): Payer: Self-pay

## 2022-08-13 MED ORDER — TESTOSTERONE CYPIONATE 200 MG/ML IM SOLN
100.0000 mg | INTRAMUSCULAR | 0 refills | Status: AC
  Filled 2022-08-13: qty 6, 42d supply, fill #0
  Filled 2022-08-13: qty 4, 28d supply, fill #0

## 2022-08-21 ENCOUNTER — Other Ambulatory Visit (HOSPITAL_COMMUNITY): Payer: Self-pay

## 2023-01-14 ENCOUNTER — Other Ambulatory Visit (HOSPITAL_BASED_OUTPATIENT_CLINIC_OR_DEPARTMENT_OTHER): Payer: Self-pay | Admitting: *Deleted

## 2023-01-14 ENCOUNTER — Encounter (HOSPITAL_BASED_OUTPATIENT_CLINIC_OR_DEPARTMENT_OTHER): Payer: Self-pay | Admitting: *Deleted

## 2023-01-14 DIAGNOSIS — R600 Localized edema: Secondary | ICD-10-CM

## 2023-01-15 ENCOUNTER — Ambulatory Visit (HOSPITAL_BASED_OUTPATIENT_CLINIC_OR_DEPARTMENT_OTHER)
Admission: RE | Admit: 2023-01-15 | Discharge: 2023-01-15 | Disposition: A | Discharge status: Home / Self Care | Payer: Managed Care, Other (non HMO) | Source: Ambulatory Visit | Attending: *Deleted | Admitting: *Deleted

## 2023-01-15 DIAGNOSIS — R600 Localized edema: Secondary | ICD-10-CM | POA: Diagnosis present

## 2023-01-15 DIAGNOSIS — R590 Localized enlarged lymph nodes: Secondary | ICD-10-CM | POA: Insufficient documentation

## 2024-03-03 ENCOUNTER — Ambulatory Visit: Admitting: Podiatry

## 2024-03-03 ENCOUNTER — Encounter: Payer: Self-pay | Admitting: Podiatry

## 2024-03-03 DIAGNOSIS — L02611 Cutaneous abscess of right foot: Secondary | ICD-10-CM

## 2024-03-03 DIAGNOSIS — L600 Ingrowing nail: Secondary | ICD-10-CM

## 2024-03-03 NOTE — Progress Notes (Signed)
 Patient complains of painful ingrown both borders hallux right.  Taking doxycycline from his family doctor.  Has been bothering him for several years but has not gotten this bad before.  Also complains of pain in the left hallux at times.  This borders tend to curving in and get ingrown and he has to digging the nail.. Patient denies fevers, chills, nausea, vomiting.  Objective:  Vitals: Reviewed  General: Well developed, nourished, in no acute distress, alert and oriented x3   Vascular: DP pulse 2 /4 bilateral. PT pulse 2/4 bilateral.  Mild edema toe with ingrown nail.  Capillary refill time immediate bilaterally  Dermatology: Erythema, edema, incurvated nail border both borders hallux right with clear drainage and granulation tissue.. Tenderness present with palpation. Normal skin tone and texture feet with normal hair growth.  Incurvated borders with some soreness on the borders of the hallux nail left also.  Neurological: Grossly intact. Normal reflexes.   Musculoskeletal: Tenderness with palpation of the distal hallux bilaterally. No tenderness or painful ROM at IPJ.  Diagnosis: 1.  Cellulitis foot right.   2.  Ingrown nail both borders hallux right  Plan: -New patient office visit for evaluation and management.  Modifier 25. -discussed etiology and treatment of ingrown nails. Discussed surgical vs conservative treatment.  Dolen of the left hallux borders on the nail continue to bother her and would recommend matrixectomy on these also. -Consent signed for appropriate matrixectomy affected nail(s). -Take doxycycline as prescribed and finish  Procedure(s):   - Matrixectomy(s) both borders hallux left: Toe anesthetized with 3cc 2:1 mixture 2% Lidocaine  with epinephrine : Sodium Bicarbonate. Surgical site prepped. Digital tourniquet applied.  Avulsion of nail borders. performed. Matrixecomy performed with three 30 second applications of phenol to nail matrix. Site irrigated with alcohol.   Tourniquet released with good vascularity noticed in digit.  Applied triple antibiotic to nailbed and applied gauze and Coban dressing. - Written and oral postoperative instructions given.  -Return for post-op 2 weeks.  JINNY Prentice Binder, DPM

## 2024-03-03 NOTE — Patient Instructions (Signed)

## 2024-03-17 ENCOUNTER — Ambulatory Visit: Admitting: Podiatry

## 2024-03-17 ENCOUNTER — Encounter: Payer: Self-pay | Admitting: Podiatry

## 2024-03-17 VITALS — Ht 70.0 in | Wt 265.0 lb

## 2024-03-17 DIAGNOSIS — L600 Ingrowing nail: Secondary | ICD-10-CM

## 2024-03-17 NOTE — Progress Notes (Signed)
 Patient presents follow-up nail surgery toe.  No complaints.  Physical exam:  Dermatologic: Nail surgery site for matrixectomy healing well with no signs of infection.  Diagnosis: 1.  Status post matrixectomy toe.  Healing well  Plan: - POV status post nail surgery , if patient has any problems she can call for appointment otherwise we can see her as needed  - Return as needed
